# Patient Record
Sex: Female | Born: 1985 | Hispanic: Yes | Marital: Single | Smoking: Never smoker
Health system: Southern US, Community
[De-identification: ages and names within clinical notes are randomized; demographics above are authoritative.]

## PROBLEM LIST (undated history)

## (undated) DIAGNOSIS — K529 Noninfective gastroenteritis and colitis, unspecified: Secondary | ICD-10-CM

## (undated) DIAGNOSIS — K8020 Calculus of gallbladder without cholecystitis without obstruction: Secondary | ICD-10-CM

## (undated) HISTORY — PX: NO PAST SURGERIES: SHX2092

---

## 2017-09-15 ENCOUNTER — Encounter: Payer: Self-pay | Admitting: Emergency Medicine

## 2017-09-15 ENCOUNTER — Inpatient Hospital Stay
Admission: EM | Admit: 2017-09-15 | Discharge: 2017-09-18 | DRG: 418 | Disposition: A | Discharge status: Home / Self Care | Payer: Self-pay | Attending: Internal Medicine | Admitting: Internal Medicine

## 2017-09-15 ENCOUNTER — Emergency Department: Payer: Self-pay

## 2017-09-15 DIAGNOSIS — Z881 Allergy status to other antibiotic agents status: Secondary | ICD-10-CM

## 2017-09-15 DIAGNOSIS — K810 Acute cholecystitis: Secondary | ICD-10-CM | POA: Diagnosis present

## 2017-09-15 DIAGNOSIS — E876 Hypokalemia: Secondary | ICD-10-CM | POA: Diagnosis present

## 2017-09-15 DIAGNOSIS — R109 Unspecified abdominal pain: Secondary | ICD-10-CM

## 2017-09-15 DIAGNOSIS — K859 Acute pancreatitis without necrosis or infection, unspecified: Secondary | ICD-10-CM | POA: Diagnosis present

## 2017-09-15 DIAGNOSIS — K8020 Calculus of gallbladder without cholecystitis without obstruction: Secondary | ICD-10-CM | POA: Diagnosis present

## 2017-09-15 DIAGNOSIS — K5900 Constipation, unspecified: Secondary | ICD-10-CM | POA: Diagnosis present

## 2017-09-15 DIAGNOSIS — K851 Biliary acute pancreatitis without necrosis or infection: Principal | ICD-10-CM | POA: Diagnosis present

## 2017-09-15 HISTORY — DX: Noninfective gastroenteritis and colitis, unspecified: K52.9

## 2017-09-15 HISTORY — DX: Calculus of gallbladder without cholecystitis without obstruction: K80.20

## 2017-09-15 LAB — CBC
HEMATOCRIT: 38.6 % (ref 35.0–47.0)
HEMOGLOBIN: 13.4 g/dL (ref 12.0–16.0)
MCH: 28.9 pg (ref 26.0–34.0)
MCHC: 34.6 g/dL (ref 32.0–36.0)
MCV: 83.4 fL (ref 80.0–100.0)
Platelets: 305 10*3/uL (ref 150–440)
RBC: 4.63 MIL/uL (ref 3.80–5.20)
RDW: 12.8 % (ref 11.5–14.5)
WBC: 6.4 10*3/uL (ref 3.6–11.0)

## 2017-09-15 MED ORDER — MORPHINE SULFATE (PF) 4 MG/ML IV SOLN
4.0000 mg | Freq: Once | INTRAVENOUS | Status: AC
  Administered 2017-09-15: 4 mg via INTRAVENOUS
  Filled 2017-09-15: qty 1

## 2017-09-15 MED ORDER — SODIUM CHLORIDE 0.9 % IV BOLUS (SEPSIS)
1000.0000 mL | Freq: Once | INTRAVENOUS | Status: AC
  Administered 2017-09-15: 1000 mL via INTRAVENOUS

## 2017-09-15 MED ORDER — ONDANSETRON HCL 4 MG/2ML IJ SOLN
4.0000 mg | Freq: Once | INTRAMUSCULAR | Status: AC
  Administered 2017-09-15: 4 mg via INTRAVENOUS
  Filled 2017-09-15: qty 2

## 2017-09-15 NOTE — ED Notes (Addendum)
Pt c/o of R upper abd pain that radiates around to R side and back about 1hr ago. Pt appears uncomfortable. Dr. Zenda Alpers at bedside. nausea when pain began but denies vomiting. States feels constipation so took lactulose yesterday. Denies urinary symptoms. Does state pain goes to chest. Hx of gallstone a year ago. Still has gallbladder.

## 2017-09-15 NOTE — ED Notes (Signed)
Pt taken to US via stretcher

## 2017-09-15 NOTE — ED Triage Notes (Signed)
Pt speaks Spanish and translator present for triage, patient complaining of epigastric pain x 1 hour. Patient states she felt constipated and went to use bathroom and felt a warm feeling on bilateral flanks and epigastric pain that wrapped around to the right side down. Patient has hx of gallstones. Patient states her pain is 8/10. Pt denies nausea or vomitting.

## 2017-09-15 NOTE — ED Provider Notes (Signed)
[facility] Emergency Department Provider Note   ____________________________________________   First MD Initiated Contact with Patient 09/15/17 2307     (approximate)  I have reviewed the triage vital signs and the nursing notes.   HISTORY  Chief Complaint Abdominal Pain    HPI Mckenzie Phelps is a 31 y.o. female who comes into the hospital today with some right upper quadrant abdominal pain. She thinks that her gallbladder. She states that the pain goes down to her ovary on the right and it goes around to her kidneys. The patient also has some right shoulder pain. The symptoms started about an hour prior to her arrival. The patient states that she felt hot in her neck and felt anxious and short of breath. She is not taking anything for pain and she denies ever having pain like this in the past. The patient has been diagnosed with gallstones before and she is not had surgery to have them removed. The patient endorses some nausea with no vomiting. Her pain is 8 out of 10 in intensity currently. The patient states that the pain also goes up into her chest from her abdomen. She states that she has been constipated. She took some lactulose yesterday and reports that she had a long skinny stool about 2 hours prior to her arrival. The patient has come into the hospital today for further evaluation of her symptoms.The patient also denies pain with urination or hematuria.   Past Medical History:  Diagnosis Date  . Gall stone     Patient Active Problem List   Diagnosis Date Noted  . Pancreatitis 09/16/2017  . Gallstone 09/16/2017    Past Surgical History:  Procedure Laterality Date  . NO PAST SURGERIES      Prior to Admission medications   Not on File    Allergies Ciprofloxacin  Family History  Problem Relation Age of Onset  . Family history unknown: Yes    Social History Social History  Substance Use Topics  . Smoking status: Never  Smoker  . Smokeless tobacco: Not on file  . Alcohol use No    Review of Systems  Constitutional: No fever/chills Eyes: No visual changes. ENT: No sore throat. Cardiovascular: chest pain. Respiratory:  shortness of breath. Gastrointestinal:  abdominal pain.   nausea, no vomiting.  No diarrhea.  No constipation. Genitourinary: Negative for dysuria. Musculoskeletal: Negative for back pain. Skin: Negative for rash. Neurological: Negative for headaches, focal weakness or numbness.   ____________________________________________   PHYSICAL EXAM:  VITAL SIGNS: ED Triage Vitals  Enc Vitals Group     BP 09/15/17 2253 114/60     Pulse Rate 09/15/17 2253 64     Resp --      Temp 09/15/17 2253 97.6 F (36.4 C)     Temp Source 09/15/17 2253 Oral     SpO2 09/15/17 2253 100 %     Weight --      Height --      Head Circumference --      Peak Flow --      Pain Score 09/15/17 2301 8     Pain Loc --      Pain Edu? --      Excl. in GC? --     Constitutional: Alert and oriented. Well appearing and in moderate distress. Eyes: Conjunctivae are normal. PERRL. EOMI. Head: Atraumatic. Nose: No congestion/rhinnorhea. Mouth/Throat: Mucous membranes are moist.  Oropharynx non-erythematous. Cardiovascular: Normal rate, regular rhythm. Grossly normal heart sounds.  Good peripheral circulation. Respiratory: Normal respiratory effort.  No retractions. Lungs CTAB. Gastrointestinal: Soft with some diffuse abdominal tenderness to palpation worse on the right upper quadrant and epigastric area. No distention. positive bowel sounds Musculoskeletal: No lower extremity tenderness nor edema.   Neurologic:  Normal speech and language.  Skin:  Skin is warm, dry and intact.  Psychiatric: Mood and affect are normal.   ____________________________________________   LABS (all labs ordered are listed, but only abnormal results are displayed)  Labs Reviewed  LIPASE, BLOOD - Abnormal; Notable for the  following:       Result Value   Lipase 995 (*)    All other components within normal limits  COMPREHENSIVE METABOLIC PANEL - Abnormal; Notable for the following:    Potassium 3.4 (*)    Glucose, Bld 128 (*)    AST 86 (*)    All other components within normal limits  CBC  TROPONIN I  URINALYSIS, COMPLETE (UACMP) WITH MICROSCOPIC  POC URINE PREG, ED   ____________________________________________  EKG  ED ECG REPORT I, Rebecka Apley, the attending physician, personally viewed and interpreted this ECG.   Date: 09/15/2017  EKG Time: 146  Rate: 84  Rhythm: normal sinus rhythm  Axis: normal  Intervals:none  ST&T Change: none  ____________________________________________  RADIOLOGY  Dg Chest Portable 1 View  Result Date: 09/16/2017 CLINICAL DATA:  Epigastric pain.  Chest pain. EXAM: PORTABLE CHEST 1 VIEW COMPARISON:  None. FINDINGS: The cardiomediastinal contours are normal. The lungs are clear. Pulmonary vasculature is normal. No consolidation, pleural effusion, or pneumothorax. No acute osseous abnormalities are seen. IMPRESSION: Unremarkable portable AP view of the chest. Electronically Signed   By: Rubye Oaks M.D.   On: 09/16/2017 00:31   US Abdomen Limited Ruq  Result Date: 09/16/2017 CLINICAL DATA:  31 y/o  F; several hours of abdominal pain. EXAM: ULTRASOUND ABDOMEN LIMITED RIGHT UPPER QUADRANT COMPARISON:  None. FINDINGS: Gallbladder: Mobile gallstone measuring up to 1.4 cm. No gallbladder wall thickening or pericholecystic fluid. Negative sonographic Murphy's sign. Common bile duct: Diameter: 4.3 mm Liver: No focal lesion identified. Within normal limits in parenchymal echogenicity. Portal vein is patent on color Doppler imaging with normal direction of blood flow towards the liver. IMPRESSION: Cholelithiasis.  No findings of acute cholecystitis. Electronically Signed   By: Mitzi Hansen M.D.   On: 09/16/2017 00:00     ____________________________________________   PROCEDURES  Procedure(s) performed: None  Procedures  Critical Care performed: No  ____________________________________________   INITIAL IMPRESSION / ASSESSMENT AND PLAN / ED COURSE  Pertinent labs & imaging results that were available during my care of the patient were reviewed by me and considered in my medical decision making (see chart for details).  This is a 31 year old female who comes into the hospital today with some abdominal pain. The patient has this pain that radiates into her back as well as into her lower abdomen and chest. The patient does have a history of gallstones who will check some blood work to include a troponin, lipase CMP and CBC and I will also perform an ultrasound to look at the patient's gallbladder. I will give the patient some morphine as well as some Zofran. My differential diagnosis includes biliary disease, pancreatitis, gastritis. I will reassess the patient once I received her results.     The patient's ultrasound showed some gallstones but she does appear to have some acute pancreatitis. Although the patient states her pain is improved I will admit her to  the hospital for further evaluation of this first episode of pancreatitis. ____________________________________________   FINAL CLINICAL IMPRESSION(S) / ED DIAGNOSES  Final diagnoses:  Abdominal pain  Acute pancreatitis, unspecified complication status, unspecified pancreatitis type      NEW MEDICATIONS STARTED DURING THIS VISIT:  New Prescriptions   No medications on file     Note:  This document was prepared using Dragon voice recognition software and may include unintentional dictation errors.    Rebecka Apley, MD 09/16/17 [id number]

## 2017-09-16 ENCOUNTER — Emergency Department: Payer: Self-pay

## 2017-09-16 ENCOUNTER — Encounter: Payer: Self-pay | Admitting: Internal Medicine

## 2017-09-16 ENCOUNTER — Inpatient Hospital Stay: Payer: Self-pay

## 2017-09-16 DIAGNOSIS — K8050 Calculus of bile duct without cholangitis or cholecystitis without obstruction: Secondary | ICD-10-CM

## 2017-09-16 DIAGNOSIS — K8020 Calculus of gallbladder without cholecystitis without obstruction: Secondary | ICD-10-CM | POA: Diagnosis present

## 2017-09-16 DIAGNOSIS — K859 Acute pancreatitis without necrosis or infection, unspecified: Secondary | ICD-10-CM | POA: Diagnosis present

## 2017-09-16 LAB — COMPREHENSIVE METABOLIC PANEL
ALBUMIN: 4.2 g/dL (ref 3.5–5.0)
ALBUMIN: 3.7 g/dL (ref 3.5–5.0)
ALK PHOS: 113 U/L (ref 38–126)
ALK PHOS: 123 U/L (ref 38–126)
ALT: 27 U/L (ref 14–54)
ALT: 113 U/L — ABNORMAL HIGH (ref 14–54)
ALT: 98 U/L — ABNORMAL HIGH (ref 14–54)
ANION GAP: 8 (ref 5–15)
ANION GAP: 8 (ref 5–15)
AST: 86 U/L — AB (ref 15–41)
AST: 290 U/L — ABNORMAL HIGH (ref 15–41)
AST: 145 U/L — ABNORMAL HIGH (ref 15–41)
Albumin: 3.5 g/dL (ref 3.5–5.0)
Alkaline Phosphatase: 101 U/L (ref 38–126)
Anion gap: 6 (ref 5–15)
BILIRUBIN TOTAL: 0.9 mg/dL (ref 0.3–1.2)
BILIRUBIN TOTAL: 0.8 mg/dL (ref 0.3–1.2)
BILIRUBIN TOTAL: 0.8 mg/dL (ref 0.3–1.2)
BUN: 9 mg/dL (ref 6–20)
BUN: 7 mg/dL (ref 6–20)
BUN: <5 mg/dL — ABNORMAL LOW (ref 6–20)
CALCIUM: 8.4 mg/dL — AB (ref 8.9–10.3)
CALCIUM: 8.4 mg/dL — AB (ref 8.9–10.3)
CHLORIDE: 106 mmol/L (ref 101–111)
CO2: 27 mmol/L (ref 22–32)
CO2: 25 mmol/L (ref 22–32)
CO2: 23 mmol/L (ref 22–32)
CREATININE: 0.6 mg/dL (ref 0.44–1.00)
Calcium: 9.4 mg/dL (ref 8.9–10.3)
Chloride: 109 mmol/L (ref 101–111)
Chloride: 108 mmol/L (ref 101–111)
Creatinine, Ser: 0.64 mg/dL (ref 0.44–1.00)
Creatinine, Ser: 0.44 mg/dL (ref 0.44–1.00)
GFR calc Af Amer: >60 mL/min (ref >60)
GFR calc Af Amer: >60 mL/min (ref >60)
GFR calc non Af Amer: >60 mL/min (ref >60)
GLUCOSE: 140 mg/dL — AB (ref 65–99)
GLUCOSE: 104 mg/dL — AB (ref 65–99)
Glucose, Bld: 128 mg/dL — ABNORMAL HIGH (ref 65–99)
POTASSIUM: 3.4 mmol/L — AB (ref 3.5–5.1)
POTASSIUM: 3.5 mmol/L (ref 3.5–5.1)
Potassium: 4 mmol/L (ref 3.5–5.1)
Sodium: 141 mmol/L (ref 135–145)
Sodium: 140 mmol/L (ref 135–145)
Sodium: 139 mmol/L (ref 135–145)
TOTAL PROTEIN: 7.6 g/dL (ref 6.5–8.1)
TOTAL PROTEIN: 6.7 g/dL (ref 6.5–8.1)
TOTAL PROTEIN: 6.4 g/dL — AB (ref 6.5–8.1)

## 2017-09-16 LAB — URINALYSIS, COMPLETE (UACMP) WITH MICROSCOPIC
BILIRUBIN URINE: NEGATIVE
Bacteria, UA: NONE SEEN
GLUCOSE, UA: 50 mg/dL — AB
HGB URINE DIPSTICK: NEGATIVE
KETONES UR: 5 mg/dL — AB
LEUKOCYTES UA: NEGATIVE
NITRITE: NEGATIVE
PH: 7 (ref 5.0–8.0)
PROTEIN: NEGATIVE mg/dL
Specific Gravity, Urine: 1.014 (ref 1.005–1.030)
Squamous Epithelial / LPF: NONE SEEN

## 2017-09-16 LAB — TROPONIN I: Troponin I: <0.03 ng/mL (ref <0.03)

## 2017-09-16 LAB — CBC
HEMATOCRIT: 35.1 % (ref 35.0–47.0)
Hemoglobin: 12.3 g/dL (ref 12.0–16.0)
MCH: 29 pg (ref 26.0–34.0)
MCHC: 35.1 g/dL (ref 32.0–36.0)
MCV: 82.8 fL (ref 80.0–100.0)
Platelets: 253 10*3/uL (ref 150–440)
RBC: 4.24 MIL/uL (ref 3.80–5.20)
RDW: 12.8 % (ref 11.5–14.5)
WBC: 7.5 10*3/uL (ref 3.6–11.0)

## 2017-09-16 LAB — LIPASE, BLOOD
LIPASE: 995 U/L — AB (ref 11–51)
Lipase: 487 U/L — ABNORMAL HIGH (ref 11–51)

## 2017-09-16 MED ORDER — SODIUM CHLORIDE 0.9 % IV SOLN
INTRAVENOUS | Status: AC
  Administered 2017-09-16 (×2): via INTRAVENOUS

## 2017-09-16 MED ORDER — ENOXAPARIN SODIUM 40 MG/0.4ML ~~LOC~~ SOLN
40.0000 mg | SUBCUTANEOUS | Status: DC
  Administered 2017-09-16 – 2017-09-17 (×2): 40 mg via SUBCUTANEOUS
  Filled 2017-09-16 (×2): qty 0.4

## 2017-09-16 MED ORDER — SODIUM CHLORIDE 0.9 % IV SOLN
INTRAVENOUS | Status: AC
  Administered 2017-09-16 (×2): via INTRAVENOUS

## 2017-09-16 MED ORDER — KETOROLAC TROMETHAMINE 15 MG/ML IJ SOLN
15.0000 mg | Freq: Four times a day (QID) | INTRAMUSCULAR | Status: DC | PRN
  Filled 2017-09-16: qty 1

## 2017-09-16 MED ORDER — KETOROLAC TROMETHAMINE 30 MG/ML IJ SOLN
INTRAMUSCULAR | Status: AC
  Filled 2017-09-16: qty 1

## 2017-09-16 MED ORDER — ALUM & MAG HYDROXIDE-SIMETH 200-200-20 MG/5ML PO SUSP
30.0000 mL | Freq: Four times a day (QID) | ORAL | Status: DC | PRN
  Administered 2017-09-16: 30 mL via ORAL
  Filled 2017-09-16: qty 30

## 2017-09-16 MED ORDER — ONDANSETRON HCL 4 MG/2ML IJ SOLN
4.0000 mg | Freq: Four times a day (QID) | INTRAMUSCULAR | Status: DC | PRN
  Administered 2017-09-16 (×2): 4 mg via INTRAVENOUS
  Filled 2017-09-16 (×2): qty 2

## 2017-09-16 MED ORDER — METOCLOPRAMIDE HCL 5 MG/ML IJ SOLN
10.0000 mg | Freq: Four times a day (QID) | INTRAMUSCULAR | Status: DC | PRN
  Administered 2017-09-16: 10 mg via INTRAVENOUS
  Filled 2017-09-16: qty 2

## 2017-09-16 MED ORDER — MORPHINE SULFATE (PF) 4 MG/ML IV SOLN
4.0000 mg | INTRAVENOUS | Status: DC | PRN
  Administered 2017-09-16 – 2017-09-17 (×2): 4 mg via INTRAVENOUS
  Filled 2017-09-16 (×2): qty 1

## 2017-09-16 MED ORDER — ONDANSETRON HCL 4 MG PO TABS: 4.0000 mg | ORAL_TABLET | Freq: Four times a day (QID) | ORAL | Status: DC | PRN

## 2017-09-16 MED ORDER — ACETAMINOPHEN 650 MG RE SUPP: 650.0000 mg | Freq: Four times a day (QID) | RECTAL | Status: DC | PRN

## 2017-09-16 MED ORDER — ACETAMINOPHEN 325 MG PO TABS: 650.0000 mg | ORAL_TABLET | Freq: Four times a day (QID) | ORAL | Status: DC | PRN

## 2017-09-16 NOTE — Progress Notes (Signed)
[employer] - [place] [facility]   PATIENT NAME: Mckenzie Phelps    MR#:  [id number]  DATE OF BIRTH:  25-Feb-1986  SUBJECTIVE:  CHIEF COMPLAINT:   Chief Complaint  Patient presents with  . Abdominal Pain   Better abdominal pain and nausea, no vomiting or diarrhea No appetite to eat. REVIEW OF SYSTEMS:  Review of Systems  Constitutional: Negative for chills, fever and malaise/fatigue.  HENT: Negative for sore throat.   Eyes: Negative for blurred vision and double vision.  Respiratory: Negative for cough, hemoptysis, shortness of breath, wheezing and stridor.   Cardiovascular: Negative for chest pain, palpitations, orthopnea and leg swelling.  Gastrointestinal: Positive for abdominal pain and nausea. Negative for blood in stool, diarrhea, melena and vomiting.  Genitourinary: Negative for dysuria, flank pain and hematuria.  Musculoskeletal: Negative for back pain and joint pain.  Skin: Negative for rash.  Neurological: Negative for dizziness, sensory change, focal weakness, seizures, loss of consciousness, weakness and headaches.  Endo/Heme/Allergies: Negative for polydipsia.  Psychiatric/Behavioral: Negative for depression. The patient is not nervous/anxious.     DRUG ALLERGIES:   Allergies  Allergen Reactions  . Ciprofloxacin Swelling   VITALS:  Blood pressure 110/73, pulse 74, temperature 98 F (36.7 C), temperature source Oral, resp. rate 19, height  (1.549 m), weight 109 lb 8 oz (49.7 kg), last menstrual period 09/14/2017, SpO2 99 %. PHYSICAL EXAMINATION:  Physical Exam  Constitutional: She is oriented to person, place, and time and well-developed, well-nourished, and in no distress.  HENT:  Head: Normocephalic.  Mouth/Throat: Oropharynx is clear and moist.  Eyes: Pupils are equal, round, and reactive to light. Conjunctivae and EOM are normal. No scleral icterus.  Neck: Normal range of motion. Neck supple. No JVD present. No  tracheal deviation present.  Cardiovascular: Normal rate, regular rhythm and normal heart sounds.  Exam reveals no gallop.   No murmur heard. Pulmonary/Chest: Effort normal and breath sounds normal. No respiratory distress. She has no wheezes. She has no rales.  Abdominal: Soft. Bowel sounds are normal. She exhibits no distension. There is tenderness. There is no rebound.  Musculoskeletal: Normal range of motion. She exhibits no edema or tenderness.  Neurological: She is alert and oriented to person, place, and time. No cranial nerve deficit.  Skin: No rash noted. No erythema.  Psychiatric: Affect normal.   LABORATORY PANEL:  Female CBC  Recent Labs Lab 09/16/17 0446  WBC 7.5  HGB 12.3  HCT 35.1  PLT 253   ------------------------------------------------------------------------------------------------------------------ Chemistries   Recent Labs Lab 09/16/17 1205  NA 139  K 3.5  CL 108  CO2 23  GLUCOSE 104*  BUN <5*  CREATININE 0.44  CALCIUM 8.4*  AST 145*  ALT 98*  ALKPHOS 123  BILITOT 0.8   RADIOLOGY:  Dg Chest Portable 1 View  Result Date: 09/16/2017 CLINICAL DATA:  Epigastric pain.  Chest pain. EXAM: PORTABLE CHEST 1 VIEW COMPARISON:  None. FINDINGS: The cardiomediastinal contours are normal. The lungs are clear. Pulmonary vasculature is normal. No consolidation, pleural effusion, or pneumothorax. No acute osseous abnormalities are seen. IMPRESSION: Unremarkable portable AP view of the chest. Electronically Signed   By: Rubye Oaks M.D.   On: 09/16/2017 00:31   US Abdomen Limited Ruq  Result Date: 09/16/2017 CLINICAL DATA:  31 y/o  F; several hours of abdominal pain. EXAM: ULTRASOUND ABDOMEN LIMITED RIGHT UPPER QUADRANT COMPARISON:  None. FINDINGS: Gallbladder: Mobile gallstone measuring up to 1.4 cm. No gallbladder wall thickening or pericholecystic  fluid. Negative sonographic Murphy's sign. Common bile duct: Diameter: 4.3 mm Liver: No focal lesion identified.  Within normal limits in parenchymal echogenicity. Portal vein is patent on color Doppler imaging with normal direction of blood flow towards the liver. IMPRESSION: Cholelithiasis.  No findings of acute cholecystitis. Electronically Signed   By: Mitzi Hansen M.D.   On: 09/16/2017 00:00   ASSESSMENT AND PLAN:   Acute pancreatitis due to gallstone. Per Dr. Tobi Bastos, MRCP to r/o stones in the CBD and surgery consult for cholecystectomy. Advance diet as soon as patient wishes to eat , can start with clears and progress.  with when necessary analgesia and antiemetics, IV fluids.  Abnormal liver function tests. Due to above. Follow-up LFT.   I discussed with Dr. Tobi Bastos. All the records are reviewed and case discussed with Care Management/Social Worker. Management plans discussed with the patient, family and they are in agreement.  CODE STATUS: Full Code  TOTAL TIME TAKING CARE OF THIS PATIENT: 36 minutes.   More than 50% of the time was spent in counseling/coordination of care: YES  POSSIBLE D/C IN 2 DAYS, DEPENDING ON CLINICAL CONDITION.   Shaune Pollack M.D on 09/16/2017 at 3:46 PM  Between 7am to 6pm - Pager - [phone]  After 6pm go to [email or web address] - Therapist, nutritional Hospitalists

## 2017-09-16 NOTE — Progress Notes (Signed)
Interpreter informed the patient about MRCP scheduled this evening.  Order received from Dr Imogene Burn for a clear liquid diet

## 2017-09-16 NOTE — Consult Note (Signed)
Patient ID: Mckenzie Phelps, female   DOB: 1986-11-04, 31 y.o.   MRN: [id number]  CC: Abdominal pain  HPI Mckenzie Phelps is a 31 y.o. female who is currently admitted to the medicine service. General surgery consult requested by Dr. Imogene Burn for evaluation of gallstone pancreatitis. Patient is Spanish-speaking and her history was obtained concurrently with myself and my partner Dr. Aleen Campi. Patient reports that her constant pain has resolved and now she is only tender in her midepigastric region. The pain has been going on after meals for many weeks. She denies any fevers, chills, nausea, vomiting, chest pain, shortness of breath, diarrhea, constipation. She states that she was told that she had a gallbladder problem before. She is otherwise in her usual state of health.  HPI  Past Medical History:  Diagnosis Date  . Gall stone     Past Surgical History:  Procedure Laterality Date  . NO PAST SURGERIES      Family History  Problem Relation Age of Onset  . Family history unknown: Yes    Social History Social History  Substance Use Topics  . Smoking status: Never Smoker  . Smokeless tobacco: Never Used  . Alcohol use No    Allergies  Allergen Reactions  . Ciprofloxacin Swelling    Current Facility-Administered Medications  Medication Dose Route Frequency Provider Last Rate Last Dose  . 0.9 %  sodium chloride infusion   Intravenous Continuous Shaune Pollack, MD      . acetaminophen (TYLENOL) tablet 650 mg  650 mg Oral Q6H PRN Oralia Manis, MD       Or  . acetaminophen (TYLENOL) suppository 650 mg  650 mg Rectal Q6H PRN Oralia Manis, MD      . alum & mag hydroxide-simeth (MAALOX/MYLANTA) 200-200-20 MG/5ML suspension 30 mL  30 mL Oral Q6H PRN Shaune Pollack, MD   30 mL at 09/16/17 0831  . enoxaparin (LOVENOX) injection 40 mg  40 mg Subcutaneous Q24H Oralia Manis, MD   40 mg at 09/16/17 0817  . ketorolac (TORADOL) 15 MG/ML injection 15 mg  15 mg Intravenous Q6H PRN  Oralia Manis, MD      . metoCLOPramide (REGLAN) injection 10 mg  10 mg Intravenous Q6H PRN Shaune Pollack, MD   10 mg at 09/16/17 0831  . morphine 4 MG/ML injection 4 mg  4 mg Intravenous Q4H PRN Oralia Manis, MD   4 mg at 09/16/17 0154  . ondansetron (ZOFRAN) tablet 4 mg  4 mg Oral Q6H PRN Oralia Manis, MD       Or  . ondansetron [facility]) injection 4 mg  4 mg Intravenous Q6H PRN Oralia Manis, MD   4 mg at 09/16/17 1191     Review of Systems A Multi-point review of systems was asked and was negative except for the findings documented in the history of present illness  Physical Exam Blood pressure 110/73, pulse 74, temperature 98 F (36.7 C), temperature source Oral, resp. rate 19, height  (1.549 m), weight 49.7 kg (109 lb 8 oz), last menstrual period 09/14/2017, SpO2 99 %. CONSTITUTIONAL: No acute distress. EYES: Pupils are equal, round, and reactive to light, Sclera are non-icteric. EARS, NOSE, MOUTH AND THROAT: The oropharynx is clear. The oral mucosa is pink and moist. Hearing is intact to voice. LYMPH NODES:  Lymph nodes in the neck are normal. RESPIRATORY:  Lungs are clear. There is normal respiratory effort, with equal breath sounds bilaterally, and without pathologic use of accessory muscles. CARDIOVASCULAR: Heart is  regular without murmurs, gallops, or rubs. GI: The abdomen is soft, moderately tender to superficial palpation in the midepigastric region, and nondistended. There are no palpable masses. There is no hepatosplenomegaly. There are normal bowel sounds in all quadrants. GU: Rectal deferred.   MUSCULOSKELETAL: Normal muscle strength and tone. No cyanosis or edema.   SKIN: Turgor is good and there are no pathologic skin lesions or ulcers. NEUROLOGIC: Motor and sensation is grossly normal. Cranial nerves are grossly intact. PSYCH:  Oriented to person, place and time. Affect is normal.  Data Reviewed Images and labs are reviewed which show a lipase of 487, AST of 145,  ALT of 98, bilirubin 0.8, alkaline phosphatase 123. The remainder of her labs are within normal limits including a white blood cell count of 7.5. She had an ultrasound that showed evidence of cholelithiasis but no ductal dilatation, color wall thickening, pericholecystic fluid. I have personally reviewed the patient's imaging, laboratory findings and medical records.    Assessment    Pancreatitis    Plan    31 year old female with likely gallstone pancreatitis. Discussed with the patient that the usual course is that she had passed a stone. Once her pain and labs have returned to normal within be time to discuss a laparoscopic cholecystectomy. At this point she has an MRCP pending. General surgery will follow along with you and once her pancreatitis has fully resolved she will be offered a laparoscopic cholecystectomy prior to discharge. Would recommend continuing IV fluids, limiting oral intake, serial abdominal and laboratory exams.     Time spent with the patient was 80 minutes, with more than 50% of the time spent in face-to-face education, counseling and care coordination.     Ricarda Frame, MD FACS General Surgeon 09/16/2017, 5:15 PM

## 2017-09-16 NOTE — Consult Note (Signed)
Wyline Mood MD, MRCP(U.K) [address]  [address]  [place], [place] [address]  Main: [phone]  Fax: [phone]  Consultation  Referring Provider:   Dr Imogene Burn  Primary Care Physician:  Patient, No Pcp Per Primary Gastroenterologist: None       Reason for Consultation:     Acute pancreatitis   Date of Admission:  09/15/2017 Date of Consultation:  09/16/2017         HPI:   Mckenzie Phelps is a 31 y.o. female presented to the hospital on 09/16/17 with abdominal pain. She says she has been having pain after meals for more than a few months , she was told she had a gall stone but was never referred to a Careers adviser. Yesterday she developed severe epigastric non radiating pain at 10 pm 2 hours after dinner which brought her into the hospital , she also had nausea, vomiting . Feels much better now . Denies starting any new meds, no alcohol consumption. No family history of gall bladder issues, never been on the contraceptive pill  Patient does not speak English and the whole visit was conducted via in person interpretor at the bed side provided by the hospital. She is not yet hungry at this time.   On admission Lipase elevated at 995 ,mild elevation of AST, normal bilirubin . Hb 13.4.this morning AST and ALT is further elevated. RUQ USG suggests CBD 4.3 mm , gall stone in gall bladder .   Past Medical History:  Diagnosis Date  . Gall stone     Past Surgical History:  Procedure Laterality Date  . NO PAST SURGERIES      Prior to Admission medications   Not on File    Family History  Problem Relation Age of Onset  . Family history unknown: Yes     Social History  Substance Use Topics  . Smoking status: Never Smoker  . Smokeless tobacco: Never Used  . Alcohol use No    Allergies as of 09/15/2017 - Review Complete 09/15/2017  Allergen Reaction Noted  . Ciprofloxacin Swelling 09/15/2017    Review of Systems:    All systems reviewed and negative except where noted  in HPI.   Physical Exam:  Vital signs in last 24 hours: Temp:  [97.6 F (36.4 C)-98.3 F (36.8 C)] 98.2 F (36.8 C) (09/26 0406) Pulse Rate:  [64-91] 73 (09/26 0406) Resp:  [12-23] 18 (09/26 0406) BP: (90-115)/(52-71) 95/52 (09/26 0406) SpO2:  [98 %-100 %] 100 % (09/26 0406) Weight:  [109 lb 8 oz (49.7 kg)] 109 lb 8 oz (49.7 kg) (09/26 0300) Last BM Date: 09/15/17 General:   Pleasant, cooperative in NAD Head:  Normocephalic and atraumatic. Eyes:   No icterus.   Conjunctiva pink. PERRLA. Ears:  Normal auditory acuity. Neck:  Supple; no masses or thyroidomegaly Lungs: Respirations even and unlabored. Lungs clear to auscultation bilaterally.   No wheezes, crackles, or rhonchi.  Heart:  Regular rate and rhythm;  Without murmur, clicks, rubs or gallops Abdomen:  Soft, nondistended, nontender. Normal bowel sounds. No appreciable masses or hepatomegaly.  No rebound or guarding.  Neurologic:  Alert and oriented x3;  grossly normal neurologically. Skin:  Intact without significant lesions or rashes. Cervical Nodes:  No significant cervical adenopathy. Psych:  Alert and cooperative. Normal affect.  LAB RESULTS:  Recent Labs  09/15/17 2314 09/16/17 0446  WBC 6.4 7.5  HGB 13.4 12.3  HCT 38.6 35.1  PLT 305 253   BMET  Recent Labs  09/15/17 2314 09/16/17 0446  NA 141 140  K 3.4* 4.0  CL 106 109  CO2 27 25  GLUCOSE 128* 140*  BUN 9 7  CREATININE 0.64 0.60  CALCIUM 9.4 8.4*   LFT  Recent Labs  09/16/17 0446  PROT 6.7  ALBUMIN 3.7  AST 290*  ALT 113*  ALKPHOS 113  BILITOT 0.8   PT/INR No results for input(s): LABPROT, INR in the last 72 hours.  STUDIES: Dg Chest Portable 1 View  Result Date: 09/16/2017 CLINICAL DATA:  Epigastric pain.  Chest pain. EXAM: PORTABLE CHEST 1 VIEW COMPARISON:  None. FINDINGS: The cardiomediastinal contours are normal. The lungs are clear. Pulmonary vasculature is normal. No consolidation, pleural effusion, or pneumothorax. No acute  osseous abnormalities are seen. IMPRESSION: Unremarkable portable AP view of the chest. Electronically Signed   By: Rubye Oaks M.D.   On: 09/16/2017 00:31   US Abdomen Limited Ruq  Result Date: 09/16/2017 CLINICAL DATA:  31 y/o  F; several hours of abdominal pain. EXAM: ULTRASOUND ABDOMEN LIMITED RIGHT UPPER QUADRANT COMPARISON:  None. FINDINGS: Gallbladder: Mobile gallstone measuring up to 1.4 cm. No gallbladder wall thickening or pericholecystic fluid. Negative sonographic Murphy's sign. Common bile duct: Diameter: 4.3 mm Liver: No focal lesion identified. Within normal limits in parenchymal echogenicity. Portal vein is patent on color Doppler imaging with normal direction of blood flow towards the liver. IMPRESSION: Cholelithiasis.  No findings of acute cholecystitis. Electronically Signed   By: Mitzi Hansen M.D.   On: 09/16/2017 00:00      Impression / Plan:   Mckenzie Phelps is a 31 y.o. y/o female with biochemical and history suggestive of acute pancreatitis. With transaminases elevated highly suggestive of gall stone pancreatitis. Bilirubin is not elevated and hence unsure if there is a stone in the CBD although USG does not show the same.   Plan  1. Supportive therapy with IV ringer lactate, analgesia 2. Advance diet as soon as patient wishes to eat , can start with clears and progress.  3. MRCP to r/o stones in the CBD 4. She will require a cholecystectomy -suggest surgery consult to discuss .   Thank you for involving me in the care of this patient.      LOS: 0 days   Wyline Mood, MD  09/16/2017, 11:05 AM

## 2017-09-16 NOTE — Progress Notes (Signed)
Patient continues to nauseated and vomiting after receiving zofran.  Having gas and told the interpreter she thought that was why she was nauseated (due to the gas).  Dr Imogene Burn notified and ordered reglan and maalox

## 2017-09-16 NOTE — H&P (Signed)
[facility] - [place] at [facility]   PATIENT NAME: Mckenzie Phelps    MR#:  [id number]  DATE OF BIRTH:  05/12/1986  DATE OF ADMISSION:  09/15/2017  PRIMARY CARE PHYSICIAN: Patient, No Pcp Per   REQUESTING/REFERRING PHYSICIAN: Zenda Alpers, MD  CHIEF COMPLAINT:   Chief Complaint  Patient presents with  . Abdominal Pain    HISTORY OF PRESENT ILLNESS:  Mckenzie Phelps  is a 31 y.o. female who presents with Acute onset abdominal pain. Patient states that over the past couple of days she's had some episodes of nausea. Today she developed acute epigastric pain radiating to her back. Here in the ED she was found to have pancreatitis, with a lipase greater than 900. An abdominal ultrasound she has a gallstone in her gallbladder, but her common bile duct is not dilated. Patient denies alcohol use. Hospitalists were called for admission and treatment.  PAST MEDICAL HISTORY:   Past Medical History:  Diagnosis Date  . Gall stone     PAST SURGICAL HISTORY:   Past Surgical History:  Procedure Laterality Date  . NO PAST SURGERIES      SOCIAL HISTORY:   Social History  Substance Use Topics  . Smoking status: Never Smoker  . Smokeless tobacco: Not on file  . Alcohol use No    FAMILY HISTORY:   Family History  Problem Relation Age of Onset  . Family history unknown: Yes    DRUG ALLERGIES:   Allergies  Allergen Reactions  . Ciprofloxacin Swelling    MEDICATIONS AT HOME:   Prior to Admission medications   Not on File    REVIEW OF SYSTEMS:  Review of Systems  Constitutional: Negative for chills, fever, malaise/fatigue and weight loss.  HENT: Negative for ear pain, hearing loss and tinnitus.   Eyes: Negative for blurred vision, double vision, pain and redness.  Respiratory: Negative for cough, hemoptysis and shortness of breath.   Cardiovascular: Negative for chest pain, palpitations, orthopnea and leg swelling.   Gastrointestinal: Positive for abdominal pain and nausea. Negative for constipation, diarrhea and vomiting.  Genitourinary: Negative for dysuria, frequency and hematuria.  Musculoskeletal: Negative for back pain, joint pain and neck pain.  Skin:       No acne, rash, or lesions  Neurological: Negative for dizziness, tremors, focal weakness and weakness.  Endo/Heme/Allergies: Negative for polydipsia. Does not bruise/bleed easily.  Psychiatric/Behavioral: Negative for depression. The patient is not nervous/anxious and does not have insomnia.      VITAL SIGNS:   Vitals:   09/15/17 2253 09/15/17 2332 09/16/17 0000 09/16/17 0030  BP: 114/60 104/69 103/63 (!) 98/59  Pulse: 64 72 77 72  Resp:  12 (!) 23 13  Temp: 97.6 F (36.4 C)     TempSrc: Oral     SpO2: 100% 99% 99% 100%   Wt Readings from Last 3 Encounters:  No data found for Wt    PHYSICAL EXAMINATION:  Physical Exam  Vitals reviewed. Constitutional: She is oriented to person, place, and time. She appears well-developed and well-nourished. No distress.  HENT:  Head: Normocephalic and atraumatic.  Mouth/Throat: Oropharynx is clear and moist.  Eyes: Pupils are equal, round, and reactive to light. Conjunctivae and EOM are normal. No scleral icterus.  Neck: Normal range of motion. Neck supple. No JVD present. No thyromegaly present.  Cardiovascular: Normal rate, regular rhythm and intact distal pulses.  Exam reveals no gallop and no friction rub.   No murmur heard. Respiratory: Effort normal  and breath sounds normal. No respiratory distress. She has no wheezes. She has no rales.  GI: Soft. Bowel sounds are normal. She exhibits no distension. There is tenderness.  Musculoskeletal: Normal range of motion. She exhibits no edema.  No arthritis, no gout  Lymphadenopathy:    She has no cervical adenopathy.  Neurological: She is alert and oriented to person, place, and time. No cranial nerve deficit.  No dysarthria, no aphasia   Skin: Skin is warm and dry. No rash noted. No erythema.  Psychiatric: She has a normal mood and affect. Her behavior is normal. Judgment and thought content normal.    LABORATORY PANEL:   CBC  Recent Labs Lab 09/15/17 2314  WBC 6.4  HGB 13.4  HCT 38.6  PLT 305   ------------------------------------------------------------------------------------------------------------------  Chemistries   Recent Labs Lab 09/15/17 2314  NA 141  K 3.4*  CL 106  CO2 27  GLUCOSE 128*  BUN 9  CREATININE 0.64  CALCIUM 9.4  AST 86*  ALT 27  ALKPHOS 101  BILITOT 0.9   ------------------------------------------------------------------------------------------------------------------  Cardiac Enzymes  Recent Labs Lab 09/15/17 2314  TROPONINI <0.03   ------------------------------------------------------------------------------------------------------------------  RADIOLOGY:  Dg Chest Portable 1 View  Result Date: 09/16/2017 CLINICAL DATA:  Epigastric pain.  Chest pain. EXAM: PORTABLE CHEST 1 VIEW COMPARISON:  None. FINDINGS: The cardiomediastinal contours are normal. The lungs are clear. Pulmonary vasculature is normal. No consolidation, pleural effusion, or pneumothorax. No acute osseous abnormalities are seen. IMPRESSION: Unremarkable portable AP view of the chest. Electronically Signed   By: Rubye Oaks M.D.   On: 09/16/2017 00:31   US Abdomen Limited Ruq  Result Date: 09/16/2017 CLINICAL DATA:  31 y/o  F; several hours of abdominal pain. EXAM: ULTRASOUND ABDOMEN LIMITED RIGHT UPPER QUADRANT COMPARISON:  None. FINDINGS: Gallbladder: Mobile gallstone measuring up to 1.4 cm. No gallbladder wall thickening or pericholecystic fluid. Negative sonographic Murphy's sign. Common bile duct: Diameter: 4.3 mm Liver: No focal lesion identified. Within normal limits in parenchymal echogenicity. Portal vein is patent on color Doppler imaging with normal direction of blood flow towards the  liver. IMPRESSION: Cholelithiasis.  No findings of acute cholecystitis. Electronically Signed   By: Mitzi Hansen M.D.   On: 09/16/2017 00:00    EKG:   Orders placed or performed during the hospital encounter of 09/15/17  . ED EKG  . ED EKG    IMPRESSION AND PLAN:  Principal Problem:   Pancreatitis - lipase greater than 900. Patient does have a gallstone, but is unclear at this time she may have passed a smaller gallstone as her common bile duct is not dilated. We will keep her nothing by mouth for now, with when necessary analgesia and antiemetics, IV fluids, GI consult Active Problems:   Gallstone - treatment as above  All the records are reviewed and case discussed with ED provider. Management plans discussed with the patient and/or family.  DVT PROPHYLAXIS: SubQ lovenox  GI PROPHYLAXIS: None  ADMISSION STATUS: Inpatient  CODE STATUS: Full Code Status History    This patient does not have a recorded code status. Please follow your organizational policy for patients in this situation.      TOTAL TIME TAKING CARE OF THIS PATIENT: 45 minutes.   Donte Kary FIELDING 09/16/2017, 1:23 AM  [employer]  [phone]  CC: Primary care physician; Patient, No Pcp Per  Note:  This document was prepared using Dragon voice recognition software and may include unintentional dictation errors.

## 2017-09-16 NOTE — ED Notes (Signed)
X-ray at bedside

## 2017-09-17 ENCOUNTER — Encounter: Payer: Self-pay | Admitting: Internal Medicine

## 2017-09-17 DIAGNOSIS — K859 Acute pancreatitis without necrosis or infection, unspecified: Secondary | ICD-10-CM

## 2017-09-17 LAB — COMPREHENSIVE METABOLIC PANEL
ALT: 61 U/L — ABNORMAL HIGH (ref 14–54)
AST: 48 U/L — ABNORMAL HIGH (ref 15–41)
Albumin: 3.2 g/dL — ABNORMAL LOW (ref 3.5–5.0)
Alkaline Phosphatase: 107 U/L (ref 38–126)
Anion gap: 4 — ABNORMAL LOW (ref 5–15)
BUN: <5 mg/dL — ABNORMAL LOW (ref 6–20)
CHLORIDE: 112 mmol/L — AB (ref 101–111)
CO2: 24 mmol/L (ref 22–32)
CREATININE: 0.46 mg/dL (ref 0.44–1.00)
Calcium: 8.1 mg/dL — ABNORMAL LOW (ref 8.9–10.3)
Glucose, Bld: 87 mg/dL (ref 65–99)
Potassium: 3.3 mmol/L — ABNORMAL LOW (ref 3.5–5.1)
SODIUM: 140 mmol/L (ref 135–145)
Total Bilirubin: 0.9 mg/dL (ref 0.3–1.2)
Total Protein: 5.8 g/dL — ABNORMAL LOW (ref 6.5–8.1)

## 2017-09-17 LAB — HIV ANTIBODY (ROUTINE TESTING W REFLEX): HIV SCREEN 4TH GENERATION: NONREACTIVE

## 2017-09-17 LAB — MAGNESIUM: MAGNESIUM: 1.8 mg/dL (ref 1.7–2.4)

## 2017-09-17 LAB — LIPASE, BLOOD: LIPASE: 62 U/L — AB (ref 11–51)

## 2017-09-17 MED ORDER — POTASSIUM CHLORIDE CRYS ER 20 MEQ PO TBCR
40.0000 meq | EXTENDED_RELEASE_TABLET | Freq: Once | ORAL | Status: AC
  Administered 2017-09-17: 40 meq via ORAL
  Filled 2017-09-17: qty 2

## 2017-09-17 MED ORDER — SODIUM CHLORIDE 0.9 % IV SOLN
INTRAVENOUS | Status: DC
  Administered 2017-09-17 (×2): via INTRAVENOUS

## 2017-09-17 NOTE — Progress Notes (Signed)
Dr Tonita Cong gave an order for clear liquid diet and then npo after midnight

## 2017-09-17 NOTE — Progress Notes (Signed)
CC: Gallstone pancreatitis Subjective: Patient reports that she does feel somewhat better than yesterday but continues to have tenderness on movement and on palpation to her abdomen. She denies any fevers, chills, nausea, vomiting.  Objective: Vital signs in last 24 hours: Temp:  [98 F (36.7 C)] 98 F (36.7 C) (09/27 0455) Pulse Rate:  [59-74] 59 (09/27 0455) Resp:  [17-19] 19 (09/27 0455) BP: (105-110)/(56-73) 106/56 (09/27 0455) SpO2:  [99 %-100 %] 99 % (09/27 0455) Last BM Date: 09/15/17  Intake/Output from previous day: 09/26 0701 - 09/27 0700 In: 2358.3 [I.V.:2358.3] Out: 1450 [Urine:1450] Intake/Output this shift: No intake/output data recorded.  Physical exam:  Gen.: No acute distress Chest: Clear to auscultation  heart: Regular rate and rhythm Abdomen: Soft, mildly tender to deep palpation in the upper abdomen, nondistended.  Lab Results: CBC   Recent Labs  09/15/17 2314 09/16/17 0446  WBC 6.4 7.5  HGB 13.4 12.3  HCT 38.6 35.1  PLT 305 253   BMET  Recent Labs  09/16/17 1205 09/17/17 0419  NA 139 140  K 3.5 3.3*  CL 108 112*  CO2 23 24  GLUCOSE 104* 87  BUN <5* <5*  CREATININE 0.44 0.46  CALCIUM 8.4* 8.1*   PT/INR No results for input(s): LABPROT, INR in the last 72 hours. ABG No results for input(s): PHART, HCO3 in the last 72 hours.  Invalid input(s): PCO2, PO2  Studies/Results: Mr Abdomen Mrcp Wo Contrast  Result Date: 09/16/2017 CLINICAL DATA:  31 year old female with history of constant abdominal pain, most severe in the mid epigastric region falling meals for the past several weeks. No associated fevers, chills, nausea, vomiting, diarrhea or constipation. EXAM: MRI ABDOMEN WITHOUT CONTRAST  (INCLUDING MRCP) TECHNIQUE: Multiplanar multisequence MR imaging of the abdomen was performed. Heavily T2-weighted images of the biliary and pancreatic ducts were obtained, and three-dimensional MRCP images were rendered by post processing.  COMPARISON:  No priors. FINDINGS: Comment: Study is limited for detection and characterization of visceral and/or vascular lesions by lack of IV gadolinium. Lower chest: Unremarkable. Hepatobiliary: No cystic or solid hepatic lesions. No intra or extrahepatic biliary ductal dilatation noted on MRCP images. Gallbladder is only moderately distended. Filling defects in the gallbladder, compatible with gallstones, measuring up to 13 mm in diameter. Gallbladder wall appears edematous with trace volume of pericholecystic fluid. No filling defect in the common bile duct noted on MRCP images. Common bile duct measures 4 mm in the porta hepatis. Pancreas: No definite pancreatic mass on today's noncontrast examination. No pancreatic ductal dilatation noted on MRCP images. Trace amount of peripancreatic T2 hyperintensity, suggesting fluid in inflammation around the pancreas, concerning for an acute pancreatitis. No well-defined peripancreatic fluid collections are noted. Spleen:  Unremarkable. Adrenals/Urinary Tract: Bilateral kidneys and bilateral adrenal glands are normal in appearance. No hydroureteronephrosis in the visualized portions of the abdomen. Stomach/Bowel: Visualized portions are unremarkable. Vascular/Lymphatic: No aneurysm identified in the visualized abdominal vasculature. No lymphadenopathy noted in the abdomen. Other: Trace volume of ascites extending beneath the right lobe of the liver. Musculoskeletal: No aggressive appearing osseous lesions are noted in the visualized portions of the skeleton. IMPRESSION: 1. Small amount of peripancreatic fluid and inflammatory changes compatible with an acute pancreatitis. 2. Interval development of edema in the gallbladder wall and trace amount of pericholecystic fluid. There are gallstones present, but the gallbladder is only moderately distended. Overall, imaging findings are equivocal for an acute cholecystitis, and the interval change in the appearance of the  gallbladder compared with yesterday's ultrasound  examination may simply be reactive related to inflammatory changes in the setting of acute pancreatitis. Clinical correlation is recommended. 3. No evidence of choledocholithiasis. No intra or extrahepatic biliary ductal dilatation to suggest obstruction. Electronically Signed   By: Trudie Reed M.D.   On: 09/16/2017 20:36   Dg Chest Portable 1 View  Result Date: 09/16/2017 CLINICAL DATA:  Epigastric pain.  Chest pain. EXAM: PORTABLE CHEST 1 VIEW COMPARISON:  None. FINDINGS: The cardiomediastinal contours are normal. The lungs are clear. Pulmonary vasculature is normal. No consolidation, pleural effusion, or pneumothorax. No acute osseous abnormalities are seen. IMPRESSION: Unremarkable portable AP view of the chest. Electronically Signed   By: Rubye Oaks M.D.   On: 09/16/2017 00:31   US Abdomen Limited Ruq  Result Date: 09/16/2017 CLINICAL DATA:  31 y/o  F; several hours of abdominal pain. EXAM: ULTRASOUND ABDOMEN LIMITED RIGHT UPPER QUADRANT COMPARISON:  None. FINDINGS: Gallbladder: Mobile gallstone measuring up to 1.4 cm. No gallbladder wall thickening or pericholecystic fluid. Negative sonographic Murphy's sign. Common bile duct: Diameter: 4.3 mm Liver: No focal lesion identified. Within normal limits in parenchymal echogenicity. Portal vein is patent on color Doppler imaging with normal direction of blood flow towards the liver. IMPRESSION: Cholelithiasis.  No findings of acute cholecystitis. Electronically Signed   By: Mitzi Hansen M.D.   On: 09/16/2017 00:00    Anti-infectives: Anti-infectives    None      Assessment/Plan:  31 year old female with gallstone pancreatitis. Through the use of interpreter discussed the usual disease progression as well as the treatment options that include a laparoscopic cholecystectomy. Discussed the rationale for performing surgery prior to discharge. Given that her lipase is still  elevated although improved discussed with the lightweight an additional day to ensure complete resolution of her pancreatitis prior to proceeding with surgery. She voiced understanding and all questions were asked. Discussion clear liquids today with nothing after midnight with anticipation of surgery tomorrow.  Anibal Quinby T. Tonita Cong, MD, [facility]  [facility] [phone] Night [facility] [phone] 09/17/2017

## 2017-09-17 NOTE — Progress Notes (Signed)
[facility] - [place] at [facility]   PATIENT NAME: Mckenzie Phelps    MR#:  [id number]  DATE OF BIRTH:  [birth year]  SUBJECTIVE:  CHIEF COMPLAINT:   Chief Complaint  Patient presents with  . Abdominal Pain   The patient has no abdominal pain, nausea or vomiting, tolerated clear liquid diet. REVIEW OF SYSTEMS:  Review of Systems  Constitutional: Negative for chills, fever and malaise/fatigue.  HENT: Negative for sore throat.   Eyes: Negative for blurred vision and double vision.  Respiratory: Negative for cough, hemoptysis, shortness of breath, wheezing and stridor.   Cardiovascular: Negative for chest pain, palpitations, orthopnea and leg swelling.  Gastrointestinal: Negative for abdominal pain, blood in stool, diarrhea, melena, nausea and vomiting.  Genitourinary: Negative for dysuria, flank pain and hematuria.  Musculoskeletal: Negative for back pain and joint pain.  Skin: Negative for rash.  Neurological: Negative for dizziness, sensory change, focal weakness, seizures, loss of consciousness, weakness and headaches.  Endo/Heme/Allergies: Negative for polydipsia.  Psychiatric/Behavioral: Negative for depression. The patient is not nervous/anxious.     DRUG ALLERGIES:   Allergies  Allergen Reactions  . Ciprofloxacin Swelling   VITALS:  Blood pressure 106/61, pulse 63, temperature 98 F (36.7 C), temperature source Oral, resp. rate 19, height  (1.549 m), weight 109 lb 8 oz (49.7 kg), last menstrual period 09/14/2017, SpO2 99 %. PHYSICAL EXAMINATION:  Physical Exam  Constitutional: She is oriented to person, place, and time and well-developed, well-nourished, and in no distress.  HENT:  Head: Normocephalic.  Mouth/Throat: Oropharynx is clear and moist.  Eyes: Pupils are equal, round, and reactive to light. Conjunctivae and EOM are normal. No scleral icterus.  Neck: Normal range of motion. Neck supple. No JVD present. No tracheal  deviation present.  Cardiovascular: Normal rate, regular rhythm and normal heart sounds.  Exam reveals no gallop.   No murmur heard. Pulmonary/Chest: Effort normal and breath sounds normal. No respiratory distress. She has no wheezes. She has no rales.  Abdominal: Soft. Bowel sounds are normal. She exhibits no distension. There is no tenderness. There is no rebound.  Musculoskeletal: Normal range of motion. She exhibits no edema or tenderness.  Neurological: She is alert and oriented to person, place, and time. No cranial nerve deficit.  Skin: No rash noted. No erythema.  Psychiatric: Affect normal.   LABORATORY PANEL:  Female CBC  Recent Labs Lab 09/16/17 0446  WBC 7.5  HGB 12.3  HCT 35.1  PLT 253   ------------------------------------------------------------------------------------------------------------------ Chemistries   Recent Labs Lab 09/17/17 0419  NA 140  K 3.3*  CL 112*  CO2 24  GLUCOSE 87  BUN <5*  CREATININE 0.46  CALCIUM 8.1*  MG 1.8  AST 48*  ALT 61*  ALKPHOS 107  BILITOT 0.9   RADIOLOGY:  Mr Abdomen Mrcp Wo Contrast  Result Date: 09/16/2017 CLINICAL DATA:  31 year old female with history of constant abdominal pain, most severe in the mid epigastric region falling meals for the past several weeks. No associated fevers, chills, nausea, vomiting, diarrhea or constipation. EXAM: MRI ABDOMEN WITHOUT CONTRAST  (INCLUDING MRCP) TECHNIQUE: Multiplanar multisequence MR imaging of the abdomen was performed. Heavily T2-weighted images of the biliary and pancreatic ducts were obtained, and three-dimensional MRCP images were rendered by post processing. COMPARISON:  No priors. FINDINGS: Comment: Study is limited for detection and characterization of visceral and/or vascular lesions by lack of IV gadolinium. Lower chest: Unremarkable. Hepatobiliary: No cystic or solid hepatic lesions. No intra or extrahepatic biliary  ductal dilatation noted on MRCP images. Gallbladder is  only moderately distended. Filling defects in the gallbladder, compatible with gallstones, measuring up to 13 mm in diameter. Gallbladder wall appears edematous with trace volume of pericholecystic fluid. No filling defect in the common bile duct noted on MRCP images. Common bile duct measures 4 mm in the porta hepatis. Pancreas: No definite pancreatic mass on today's noncontrast examination. No pancreatic ductal dilatation noted on MRCP images. Trace amount of peripancreatic T2 hyperintensity, suggesting fluid in inflammation around the pancreas, concerning for an acute pancreatitis. No well-defined peripancreatic fluid collections are noted. Spleen:  Unremarkable. Adrenals/Urinary Tract: Bilateral kidneys and bilateral adrenal glands are normal in appearance. No hydroureteronephrosis in the visualized portions of the abdomen. Stomach/Bowel: Visualized portions are unremarkable. Vascular/Lymphatic: No aneurysm identified in the visualized abdominal vasculature. No lymphadenopathy noted in the abdomen. Other: Trace volume of ascites extending beneath the right lobe of the liver. Musculoskeletal: No aggressive appearing osseous lesions are noted in the visualized portions of the skeleton. IMPRESSION: 1. Small amount of peripancreatic fluid and inflammatory changes compatible with an acute pancreatitis. 2. Interval development of edema in the gallbladder wall and trace amount of pericholecystic fluid. There are gallstones present, but the gallbladder is only moderately distended. Overall, imaging findings are equivocal for an acute cholecystitis, and the interval change in the appearance of the gallbladder compared with yesterday's ultrasound examination may simply be reactive related to inflammatory changes in the setting of acute pancreatitis. Clinical correlation is recommended. 3. No evidence of choledocholithiasis. No intra or extrahepatic biliary ductal dilatation to suggest obstruction. Electronically Signed    By: Trudie Reed M.D.   On: 09/16/2017 20:36   ASSESSMENT AND PLAN:   Acute pancreatitis due to gallstone. Per Dr. Tobi Bastos, MRCP: Overall, imaging findings are equivocal for an acute cholecystitis. No evidence of choledocholithiasis. Per Dr. Tonita Cong, laparoscopy cholecystectomy tomorrow. when necessary analgesia and antiemetics, IV fluids.  Hypokalemia. Give potassium supplement and a follow-up level. Abnormal liver function tests. Due to above. Improved. History of colitis per patient. Stable.   I discussed with Dr. Tobi Bastos. All the records are reviewed and case discussed with Care Management/Social Worker. Management plans discussed with the patient, Her father and they are in agreement.  CODE STATUS: Full Code  TOTAL TIME TAKING CARE OF THIS PATIENT: 33 minutes.   More than 50% of the time was spent in counseling/coordination of care: YES  POSSIBLE D/C IN 2 DAYS, DEPENDING ON CLINICAL CONDITION.   Shaune Pollack M.D on 09/17/2017 at 2:54 PM  Between 7am to 6pm - Pager - [phone]  After 6pm go to [email or web address] - Therapist, nutritional Hospitalists

## 2017-09-17 NOTE — Progress Notes (Signed)
Wyline Mood MD, MRCP(U.K) [address]  [address]  [place], [place] [address]  Main: [phone]    Mckenzie Phelps is being followed for gall stone pancreatitis  Day 1 of follow up   Subjective: No new complaints, doing well , no pain .    Objective: Vital signs in last 24 hours: Vitals:   09/16/17 1140 09/16/17 2009 09/17/17 0455 09/17/17 1153  BP: 110/73 105/62 (!) 106/56 106/61  Pulse: 74 73 (!) 59 63  Resp: Temp: 98 F (36.7 C) 98 F (36.7 C) 98 F (36.7 C) 98 F (36.7 C)  TempSrc: Oral Oral Oral Oral  SpO2: 99% 100% 99% 99%  Weight:      Height:       Weight change:   Intake/Output Summary (Last 24 hours) at 09/17/17 1401 Last data filed at 09/17/17 0500  Gross per 24 hour  Intake          1529.16 ml  Output             1450 ml  Net            79.16 ml     Exam: Heart:: Regular rate and rhythm, S1S2 present or without murmur or extra heart sounds Lungs: normal, clear to auscultation and clear to auscultation and percussion Abdomen: soft, nontender, normal bowel sounds   Lab Results: @ Micro Results: No results found for this or any previous visit (from the past 240 hour(s)). Studies/Results: Mr Abdomen Mrcp Wo Contrast  Result Date: 09/16/2017 CLINICAL DATA:  31 year old female with history of constant abdominal pain, most severe in the mid epigastric region falling meals for the past several weeks. No associated fevers, chills, nausea, vomiting, diarrhea or constipation. EXAM: MRI ABDOMEN WITHOUT CONTRAST  (INCLUDING MRCP) TECHNIQUE: Multiplanar multisequence MR imaging of the abdomen was performed. Heavily T2-weighted images of the biliary and pancreatic ducts were obtained, and three-dimensional MRCP images were rendered by post processing. COMPARISON:  No priors. FINDINGS: Comment: Study is limited for detection and characterization of visceral and/or vascular lesions by lack of IV gadolinium. Lower chest:  Unremarkable. Hepatobiliary: No cystic or solid hepatic lesions. No intra or extrahepatic biliary ductal dilatation noted on MRCP images. Gallbladder is only moderately distended. Filling defects in the gallbladder, compatible with gallstones, measuring up to 13 mm in diameter. Gallbladder wall appears edematous with trace volume of pericholecystic fluid. No filling defect in the common bile duct noted on MRCP images. Common bile duct measures 4 mm in the porta hepatis. Pancreas: No definite pancreatic mass on today's noncontrast examination. No pancreatic ductal dilatation noted on MRCP images. Trace amount of peripancreatic T2 hyperintensity, suggesting fluid in inflammation around the pancreas, concerning for an acute pancreatitis. No well-defined peripancreatic fluid collections are noted. Spleen:  Unremarkable. Adrenals/Urinary Tract: Bilateral kidneys and bilateral adrenal glands are normal in appearance. No hydroureteronephrosis in the visualized portions of the abdomen. Stomach/Bowel: Visualized portions are unremarkable. Vascular/Lymphatic: No aneurysm identified in the visualized abdominal vasculature. No lymphadenopathy noted in the abdomen. Other: Trace volume of ascites extending beneath the right lobe of the liver. Musculoskeletal: No aggressive appearing osseous lesions are noted in the visualized portions of the skeleton. IMPRESSION: 1. Small amount of peripancreatic fluid and inflammatory changes compatible with an acute pancreatitis. 2. Interval development of edema in the gallbladder wall and trace amount of pericholecystic fluid. There are gallstones present, but the gallbladder is only moderately distended. Overall, imaging findings are equivocal for an  acute cholecystitis, and the interval change in the appearance of the gallbladder compared with yesterday's ultrasound examination may simply be reactive related to inflammatory changes in the setting of acute pancreatitis. Clinical correlation  is recommended. 3. No evidence of choledocholithiasis. No intra or extrahepatic biliary ductal dilatation to suggest obstruction. Electronically Signed   By: Trudie Reed M.D.   On: 09/16/2017 20:36   Dg Chest Portable 1 View  Result Date: 09/16/2017 CLINICAL DATA:  Epigastric pain.  Chest pain. EXAM: PORTABLE CHEST 1 VIEW COMPARISON:  None. FINDINGS: The cardiomediastinal contours are normal. The lungs are clear. Pulmonary vasculature is normal. No consolidation, pleural effusion, or pneumothorax. No acute osseous abnormalities are seen. IMPRESSION: Unremarkable portable AP view of the chest. Electronically Signed   By: Rubye Oaks M.D.   On: 09/16/2017 00:31   US Abdomen Limited Ruq  Result Date: 09/16/2017 CLINICAL DATA:  31 y/o  F; several hours of abdominal pain. EXAM: ULTRASOUND ABDOMEN LIMITED RIGHT UPPER QUADRANT COMPARISON:  None. FINDINGS: Gallbladder: Mobile gallstone measuring up to 1.4 cm. No gallbladder wall thickening or pericholecystic fluid. Negative sonographic Murphy's sign. Common bile duct: Diameter: 4.3 mm Liver: No focal lesion identified. Within normal limits in parenchymal echogenicity. Portal vein is patent on color Doppler imaging with normal direction of blood flow towards the liver. IMPRESSION: Cholelithiasis.  No findings of acute cholecystitis. Electronically Signed   By: Mitzi Hansen M.D.   On: 09/16/2017 00:00   Medications: I have reviewed the patient's current medications. Scheduled Meds: . enoxaparin (LOVENOX) injection  40 mg Subcutaneous Q24H   Continuous Infusions: . sodium chloride 125 mL/hr at 09/17/17 0849   PRN Meds:.acetaminophen **OR** acetaminophen, alum & mag hydroxide-simeth, ketorolac, metoCLOPramide (REGLAN) injection, morphine injection, ondansetron **OR** ondansetron (ZOFRAN) IV   Assessment: Principal Problem:   Pancreatitis Active Problems:   Gallstone  Mckenzie Phelps is a 31 y.o. y/o female with  biochemical and history suggestive of acute pancreatitis. MRCP shows no stones in CBD. Seen by surgery and plan for cholecystectomy before discharge .   Plan  1.Advance diet as soon as patient wishes , doing well on clears   I will sign off.  Please call me if any further GI concerns or questions.  We would like to thank you for the opportunity to participate in the care of Mckenzie Phelps.    LOS: 1 day   Wyline Mood 09/17/2017, 2:01 PM

## 2017-09-18 ENCOUNTER — Inpatient Hospital Stay: Payer: Self-pay | Admitting: Anesthesiology

## 2017-09-18 ENCOUNTER — Encounter: Payer: Self-pay | Admitting: Certified Registered Nurse Anesthetist

## 2017-09-18 ENCOUNTER — Encounter
Admission: EM | Disposition: A | Discharge status: Home / Self Care | Payer: Self-pay | Source: Home / Self Care | Attending: Internal Medicine

## 2017-09-18 ENCOUNTER — Inpatient Hospital Stay: Payer: Self-pay

## 2017-09-18 HISTORY — PX: CHOLECYSTECTOMY: SHX55

## 2017-09-18 LAB — BASIC METABOLIC PANEL
Anion gap: 7 (ref 5–15)
BUN: <5 mg/dL — ABNORMAL LOW (ref 6–20)
CHLORIDE: 108 mmol/L (ref 101–111)
CO2: 26 mmol/L (ref 22–32)
Calcium: 8.8 mg/dL — ABNORMAL LOW (ref 8.9–10.3)
Creatinine, Ser: 0.5 mg/dL (ref 0.44–1.00)
GFR calc non Af Amer: >60 mL/min (ref >60)
Glucose, Bld: 89 mg/dL (ref 65–99)
POTASSIUM: 3.7 mmol/L (ref 3.5–5.1)
SODIUM: 141 mmol/L (ref 135–145)

## 2017-09-18 LAB — POCT PREGNANCY, URINE
PREG TEST UR: NEGATIVE
Preg Test, Ur: NEGATIVE

## 2017-09-18 SURGERY — LAPAROSCOPIC CHOLECYSTECTOMY WITH INTRAOPERATIVE CHOLANGIOGRAM
Anesthesia: General | Site: Abdomen | Wound class: Clean Contaminated

## 2017-09-18 MED ORDER — ACETAMINOPHEN 10 MG/ML IV SOLN
INTRAVENOUS | Status: DC | PRN
  Administered 2017-09-18: 1000 mg via INTRAVENOUS

## 2017-09-18 MED ORDER — ROCURONIUM BROMIDE 50 MG/5ML IV SOLN
INTRAVENOUS | Status: AC
  Filled 2017-09-18: qty 1

## 2017-09-18 MED ORDER — FENTANYL CITRATE (PF) 100 MCG/2ML IJ SOLN
25.0000 ug | INTRAMUSCULAR | Status: DC | PRN
  Administered 2017-09-18 (×4): 25 ug via INTRAVENOUS

## 2017-09-18 MED ORDER — PROPOFOL 10 MG/ML IV BOLUS
INTRAVENOUS | Status: AC
  Filled 2017-09-18: qty 20

## 2017-09-18 MED ORDER — MEPERIDINE HCL 50 MG/ML IJ SOLN: 6.2500 mg | INTRAMUSCULAR | Status: DC | PRN

## 2017-09-18 MED ORDER — PROMETHAZINE HCL 25 MG/ML IJ SOLN: 6.2500 mg | INTRAMUSCULAR | Status: DC | PRN

## 2017-09-18 MED ORDER — DEXAMETHASONE SODIUM PHOSPHATE 10 MG/ML IJ SOLN
INTRAMUSCULAR | Status: AC
  Filled 2017-09-18: qty 1

## 2017-09-18 MED ORDER — MIDAZOLAM HCL 2 MG/2ML IJ SOLN
INTRAMUSCULAR | Status: AC
  Filled 2017-09-18: qty 2

## 2017-09-18 MED ORDER — ROCURONIUM BROMIDE 100 MG/10ML IV SOLN
INTRAVENOUS | Status: DC | PRN
  Administered 2017-09-18: 30 mg via INTRAVENOUS

## 2017-09-18 MED ORDER — FENTANYL CITRATE (PF) 100 MCG/2ML IJ SOLN
INTRAMUSCULAR | Status: AC
  Filled 2017-09-18: qty 2

## 2017-09-18 MED ORDER — DEXAMETHASONE SODIUM PHOSPHATE 10 MG/ML IJ SOLN
INTRAMUSCULAR | Status: DC | PRN
  Administered 2017-09-18: 10 mg via INTRAVENOUS

## 2017-09-18 MED ORDER — ONDANSETRON HCL 4 MG/2ML IJ SOLN
INTRAMUSCULAR | Status: AC
  Filled 2017-09-18: qty 2

## 2017-09-18 MED ORDER — KETOROLAC TROMETHAMINE 30 MG/ML IJ SOLN
INTRAMUSCULAR | Status: AC
  Filled 2017-09-18: qty 1

## 2017-09-18 MED ORDER — SEVOFLURANE IN SOLN
RESPIRATORY_TRACT | Status: AC
  Filled 2017-09-18: qty 250

## 2017-09-18 MED ORDER — FENTANYL CITRATE (PF) 100 MCG/2ML IJ SOLN
INTRAMUSCULAR | Status: DC | PRN
  Administered 2017-09-18 (×2): 50 ug via INTRAVENOUS

## 2017-09-18 MED ORDER — HYDROCODONE-ACETAMINOPHEN 5-325 MG PO TABS
1.0000 | ORAL_TABLET | Freq: Four times a day (QID) | ORAL | Status: DC | PRN
  Administered 2017-09-18: 1 via ORAL
  Filled 2017-09-18: qty 1

## 2017-09-18 MED ORDER — SUGAMMADEX SODIUM 200 MG/2ML IV SOLN
INTRAVENOUS | Status: DC | PRN
  Administered 2017-09-18: 200 mg via INTRAVENOUS

## 2017-09-18 MED ORDER — DEXTROSE 5 % IV SOLN
2.0000 g | INTRAVENOUS | Status: DC
  Administered 2017-09-18: 2 g via INTRAVENOUS
  Filled 2017-09-18: qty 2

## 2017-09-18 MED ORDER — OXYCODONE HCL 5 MG PO TABS: 5.0000 mg | ORAL_TABLET | Freq: Once | ORAL | Status: DC | PRN

## 2017-09-18 MED ORDER — DEXTROSE 5 % IV SOLN: 2.0000 g | INTRAVENOUS | Status: DC

## 2017-09-18 MED ORDER — LACTATED RINGERS IV SOLN
INTRAVENOUS | Status: DC | PRN
  Administered 2017-09-18: 07:00:00 via INTRAVENOUS

## 2017-09-18 MED ORDER — SUCCINYLCHOLINE CHLORIDE 20 MG/ML IJ SOLN
INTRAMUSCULAR | Status: AC
  Filled 2017-09-18: qty 1

## 2017-09-18 MED ORDER — IOTHALAMATE MEGLUMINE 60 % INJ SOLN
INTRAMUSCULAR | Status: DC | PRN
  Administered 2017-09-18: 14 mL

## 2017-09-18 MED ORDER — KETOROLAC TROMETHAMINE 30 MG/ML IJ SOLN
INTRAMUSCULAR | Status: DC | PRN
  Administered 2017-09-18: 30 mg via INTRAVENOUS

## 2017-09-18 MED ORDER — FENTANYL CITRATE (PF) 100 MCG/2ML IJ SOLN
INTRAMUSCULAR | Status: AC
  Administered 2017-09-18: 25 ug via INTRAVENOUS
  Filled 2017-09-18: qty 2

## 2017-09-18 MED ORDER — MIDAZOLAM HCL 2 MG/2ML IJ SOLN
INTRAMUSCULAR | Status: DC | PRN
  Administered 2017-09-18: 1 mg via INTRAVENOUS

## 2017-09-18 MED ORDER — LIDOCAINE HCL (CARDIAC) 20 MG/ML IV SOLN
INTRAVENOUS | Status: DC | PRN
  Administered 2017-09-18: 80 mg via INTRAVENOUS

## 2017-09-18 MED ORDER — BUPIVACAINE HCL 0.5 % IJ SOLN
INTRAMUSCULAR | Status: DC | PRN
  Administered 2017-09-18: 20 mL

## 2017-09-18 MED ORDER — SODIUM CHLORIDE 0.9 % IJ SOLN
INTRAMUSCULAR | Status: DC | PRN
  Administered 2017-09-18: 50 mL via INTRAVENOUS

## 2017-09-18 MED ORDER — ONDANSETRON HCL 4 MG/2ML IJ SOLN
INTRAMUSCULAR | Status: DC | PRN
  Administered 2017-09-18: 4 mg via INTRAVENOUS

## 2017-09-18 MED ORDER — CEFAZOLIN SODIUM-DEXTROSE 2-3 GM-% IV SOLR
INTRAVENOUS | Status: DC | PRN
  Administered 2017-09-18: 2 g via INTRAVENOUS

## 2017-09-18 MED ORDER — SUGAMMADEX SODIUM 200 MG/2ML IV SOLN
INTRAVENOUS | Status: AC
  Filled 2017-09-18: qty 2

## 2017-09-18 MED ORDER — HYDROCODONE-ACETAMINOPHEN 5-325 MG PO TABS: 1.0000 | ORAL_TABLET | Freq: Four times a day (QID) | ORAL | 0 refills | Status: DC | PRN

## 2017-09-18 MED ORDER — OXYCODONE HCL 5 MG/5ML PO SOLN: 5.0000 mg | Freq: Once | ORAL | Status: DC | PRN

## 2017-09-18 MED ORDER — LIDOCAINE HCL (PF) 2 % IJ SOLN
INTRAMUSCULAR | Status: AC
  Filled 2017-09-18: qty 2

## 2017-09-18 MED ORDER — PROPOFOL 10 MG/ML IV BOLUS
INTRAVENOUS | Status: DC | PRN
  Administered 2017-09-18: 120 mg via INTRAVENOUS

## 2017-09-18 MED ORDER — ACETAMINOPHEN 10 MG/ML IV SOLN
INTRAVENOUS | Status: AC
  Filled 2017-09-18: qty 100

## 2017-09-18 SURGICAL SUPPLY — 43 items
ADHESIVE MASTISOL STRL (MISCELLANEOUS) ×3 IMPLANT
APPLIER CLIP ROT 10 11.4 M/L (STAPLE) ×3
BLADE SURG SZ11 CARB STEEL (BLADE) ×3 IMPLANT
CANISTER SUCT 1200ML W/VALVE (MISCELLANEOUS) ×3 IMPLANT
CATH CHOLANG 76X19 KUMAR (CATHETERS) ×3 IMPLANT
CHLORAPREP W/TINT 26ML (MISCELLANEOUS) ×3 IMPLANT
CLIP APPLIE ROT 10 11.4 M/L (STAPLE) ×1 IMPLANT
CLOSURE WOUND 1/2 X4 (GAUZE/BANDAGES/DRESSINGS) ×1
CONRAY 60ML FOR OR (MISCELLANEOUS) ×3 IMPLANT
DECANTER SPIKE VIAL GLASS SM (MISCELLANEOUS) ×6 IMPLANT
DRAPE SHEET LG 3/4 BI-LAMINATE (DRAPES) ×3 IMPLANT
DRSG TEGADERM 2-3/8X2-3/4 SM (GAUZE/BANDAGES/DRESSINGS) ×12 IMPLANT
DRSG TELFA 4X3 1S NADH ST (GAUZE/BANDAGES/DRESSINGS) ×3 IMPLANT
ELECT REM PT RETURN 9FT ADLT (ELECTROSURGICAL) ×3
ELECTRODE REM PT RTRN 9FT ADLT (ELECTROSURGICAL) ×1 IMPLANT
GLOVE BIO SURGEON STRL SZ7.5 (GLOVE) ×3 IMPLANT
GLOVE INDICATOR 8.0 STRL GRN (GLOVE) ×3 IMPLANT
GOWN STRL REUS W/ TWL LRG LVL3 (GOWN DISPOSABLE) ×3 IMPLANT
GOWN STRL REUS W/TWL LRG LVL3 (GOWN DISPOSABLE) ×6
GRASPER SUT TROCAR 14GX15 (MISCELLANEOUS) IMPLANT
IRRIGATION STRYKERFLOW (MISCELLANEOUS) ×1 IMPLANT
IRRIGATOR STRYKERFLOW (MISCELLANEOUS) ×3
IV NS 1000ML (IV SOLUTION)
IV NS 1000ML BAXH (IV SOLUTION) IMPLANT
L-HOOK LAP DISP 36CM (ELECTROSURGICAL) ×3
LABEL OR SOLS (LABEL) ×3 IMPLANT
LHOOK LAP DISP 36CM (ELECTROSURGICAL) ×1 IMPLANT
NEEDLE HYPO 25X1 1.5 SAFETY (NEEDLE) ×3 IMPLANT
NEEDLE VERESS 14GA 120MM (NEEDLE) ×3 IMPLANT
NS IRRIG 500ML POUR BTL (IV SOLUTION) ×3 IMPLANT
PACK LAP CHOLECYSTECTOMY (MISCELLANEOUS) ×3 IMPLANT
PENCIL ELECTRO HAND CTR (MISCELLANEOUS) ×3 IMPLANT
POUCH ENDO CATCH 10MM SPEC (MISCELLANEOUS) ×3 IMPLANT
SCISSORS METZENBAUM CVD 33 (INSTRUMENTS) ×3 IMPLANT
SLEEVE ENDOPATH XCEL 5M (ENDOMECHANICALS) ×6 IMPLANT
STRIP CLOSURE SKIN 1/2X4 (GAUZE/BANDAGES/DRESSINGS) ×2 IMPLANT
SUT MNCRL 4-0 (SUTURE) ×4
SUT MNCRL 4-0 27XMFL (SUTURE) ×2
SUT VICRYL 0 AB UR-6 (SUTURE) IMPLANT
SUTURE MNCRL 4-0 27XMF (SUTURE) ×2 IMPLANT
TROCAR XCEL 12X100 BLDLESS (ENDOMECHANICALS) ×3 IMPLANT
TROCAR XCEL NON-BLD 5MMX100MML (ENDOMECHANICALS) ×3 IMPLANT
TUBING INSUFFLATOR HI FLOW (MISCELLANEOUS) ×3 IMPLANT

## 2017-09-18 NOTE — Progress Notes (Signed)
Interpreter in to communicate with patient about rectal pain  Repositioned  Helped a little  Possible gas pain

## 2017-09-18 NOTE — Op Note (Signed)
Laparoscopic Cholecystectomy  Pre-operative Diagnosis: Gallstone pancreatitis  Post-operative Diagnosis: Same  Procedure: Laparoscopic cholecystectomy with intraoperative cholangiogram  Surgeon: Leonette Most T. Tonita Cong, MD FACS  Anesthesia: Gen. with endotracheal tube  Assistant: PA student  Procedure Details  The patient was seen again in the Holding Room. The benefits, complications, treatment options, and expected outcomes were discussed with the patient. The risks of bleeding, infection, recurrence of symptoms, failure to resolve symptoms, bile duct damage, bile duct leak, retained common bile duct stone, bowel injury, any of which could require further surgery and/or ERCP, stent, or papillotomy were reviewed with the patient. The likelihood of improving the patient's symptoms with return to their baseline status is good.  The patient and/or family concurred with the proposed plan, giving informed consent.  The patient was taken to Operating Room, identified as Mack Thurmon and the procedure verified as Laparoscopic Cholecystectomy.  A Time Out was held and the above information confirmed.  Prior to the induction of general anesthesia, antibiotic prophylaxis was administered. VTE prophylaxis was in place. General endotracheal anesthesia was then administered and tolerated well. After the induction, the abdomen was prepped with Chloraprep and draped in the sterile fashion. The patient was positioned in the supine position.  Local anesthetic  was injected into the skin near the umbilicus and an incision made. The Veress needle was placed. Pneumoperitoneum was then created with CO2 and tolerated well without any adverse changes in the patient's vital signs. A 5mm port was placed using an Optiview technique in the periumbilical position and the abdominal cavity was explored.  Two 5-mm ports were placed in the right upper quadrant and a 12 mm epigastric port was placed all under direct  vision. All skin incisions  were infiltrated with a local anesthetic agent before making the incision and placing the trocars.   The patient was positioned  in reverse Trendelenburg, tilted slightly to the patient's left.  The gallbladder was identified, the fundus grasped and retracted cephalad. Adhesions were lysed bluntly. The infundibulum was grasped and retracted laterally, exposing the peritoneum overlying the triangle of Calot. This was then divided and exposed in a blunt fashion. A critical view of the cystic duct and cystic artery was obtained.  The cystic duct was clearly identified and bluntly dissected.   A single clip was placed on the cystic artery and a Kumar clamp and catheter was used to perform a cholangiogram. Under fluoroscopy the cystic duct, common duct, right and left hepatic ducts, duodenum were all seen easily filling with contrast. There were no obvious filling defects or evidence of obstruction during the plantar gram. This completed the cholangiogram and the Clamp and catheter were removed without any difficulty. After which the previously dissected cystic duct and artery were serially clipped with endoclips and cut in between with Endo Shears.  The gallbladder was taken from the gallbladder fossa in a retrograde fashion with the electrocautery. The gallbladder was removed and placed in an Endocatch bag. The liver bed was irrigated and inspected. Hemostasis was achieved with the electrocautery. Copious irrigation was utilized and was repeatedly aspirated until clear.  A single area of bleeding was noted on the liver bed which was controlled with electrocautery. The gallbladder and Endocatch sac were then removed through the epigastric port site.   Inspection of the right upper quadrant was performed. No bleeding, bile duct injury or leak, or bowel injury was noted. Pneumoperitoneum was released.  The epigastric port site was closed with figure-of-eight 0 Vicryl sutures. 4-0  subcuticular Monocryl was used to close the skin. Steristrips and Mastisol and sterile dressings were  applied.  The patient was then extubated and brought to the recovery room in stable condition. Sponge, lap, and needle counts were correct at closure and at the conclusion of the case.   Findings: Acute Cholecystitis   Estimated Blood Loss: 10 mL         Drains: None         Specimens: Gallbladder           Complications: none               Keren Alverio T. Tonita Cong, MD, FACS

## 2017-09-18 NOTE — Anesthesia Procedure Notes (Signed)
Procedure Name: Intubation Date/Time: 09/18/2017 7:46 AM Performed by: Ginger Carne Pre-anesthesia Checklist: Patient identified, Emergency Drugs available, Suction available, Patient being monitored and Timeout performed Patient Re-evaluated:Patient Re-evaluated prior to induction Oxygen Delivery Method: Circle system utilized Preoxygenation: Pre-oxygenation with 100% oxygen Induction Type: IV induction Ventilation: Mask ventilation without difficulty Laryngoscope Size: Miller and 2 Grade View: Grade I Tube type: Oral Number of attempts: 1 Airway Equipment and Method: Stylet Placement Confirmation: ETT inserted through vocal cords under direct vision,  positive ETCO2 and breath sounds checked- equal and bilateral Secured at: 20 cm Tube secured with: Tape Dental Injury: Teeth and Oropharynx as per pre-operative assessment  Difficulty Due To: Difficulty was unanticipated

## 2017-09-18 NOTE — Transfer of Care (Signed)
Immediate Anesthesia Transfer of Care Note  Patient: Mckenzie Phelps  Procedure(s) Performed: Procedure(s): LAPAROSCOPIC CHOLECYSTECTOMY WITH INTRAOPERATIVE CHOLANGIOGRAM (N/A)  Patient Location: PACU  Anesthesia Type:General  Level of Consciousness: awake and alert   Airway & Oxygen Therapy: Patient Spontanous Breathing and Patient connected to face mask oxygen  Post-op Assessment: Report given to RN and Post -op Vital signs reviewed and stable  Post vital signs: Reviewed and stable  Last Vitals:  Vitals:   09/18/17 0455 09/18/17 0851  BP: (!) 99/59 112/68  Pulse: 65 91  Resp:  19  Temp: 36.8 C 36.8 C  SpO2: 100% 99%    Last Pain:  Vitals:   09/18/17 0851  TempSrc: Temporal  PainSc:          Complications: No apparent anesthesia complications

## 2017-09-18 NOTE — Progress Notes (Signed)
POCT pregnancy ordered. Done through glucometer. Resulted negative. Mckenzie Phelps E 7:39 AM 09/18/2017

## 2017-09-18 NOTE — Anesthesia Postprocedure Evaluation (Signed)
Anesthesia Post Note  Patient: Mckenzie Phelps  Procedure(s) Performed: Procedure(s) (LRB): LAPAROSCOPIC CHOLECYSTECTOMY WITH INTRAOPERATIVE CHOLANGIOGRAM (N/A)  Patient location during evaluation: PACU Anesthesia Type: General Level of consciousness: awake and alert and oriented Pain management: pain level controlled Vital Signs Assessment: post-procedure vital signs reviewed and stable Respiratory status: spontaneous breathing, nonlabored ventilation and respiratory function stable Cardiovascular status: blood pressure returned to baseline and stable Postop Assessment: no signs of nausea or vomiting Anesthetic complications: no     Last Vitals:  Vitals:   09/18/17 0941 09/18/17 0953  BP:    Pulse: 87   Resp: 16   Temp:  37.3 C  SpO2: 97%     Last Pain:  Vitals:   09/18/17 0941  TempSrc:   PainSc: 4                  Broderic Bara

## 2017-09-18 NOTE — Progress Notes (Signed)
CC: gallstone pancreatitis Subjective: Patient reports continued improvement overnight. States she is ready to have her gallbladder removed.  Objective: Vital signs in last 24 hours: Temp:  [98 F (36.7 C)-98.3 F (36.8 C)] 98.2 F (36.8 C) (09/28 0455) Pulse Rate:  [63-72] 65 (09/28 0455) BP: (99-110)/(56-61) 99/59 (09/28 0455) SpO2:  [99 %-100 %] 100 % (09/28 0455) Last BM Date: 09/17/17  Intake/Output from previous day: 09/27 0701 - 09/28 0700 In: 1339.2 [P.O.:360; I.V.:979.2] Out: -  Intake/Output this shift: No intake/output data recorded.  Physical exam:  Gen: NAD CV: RRR Pulm: CTA Abd: Soft, ND, NT  Lab Results: CBC   Recent Labs  09/15/17 2314 09/16/17 0446  WBC 6.4 7.5  HGB 13.4 12.3  HCT 38.6 35.1  PLT 305 253   BMET  Recent Labs  09/17/17 0419 09/18/17 0423  NA 140 141  K 3.3* 3.7  CL 112* 108  CO2 24 26  GLUCOSE 87 89  BUN <5* <5*  CREATININE 0.46 0.50  CALCIUM 8.1* 8.8*   PT/INR No results for input(s): LABPROT, INR in the last 72 hours. ABG No results for input(s): PHART, HCO3 in the last 72 hours.  Invalid input(s): PCO2, PO2  Studies/Results: Mr Abdomen Mrcp Wo Contrast  Result Date: 09/16/2017 CLINICAL DATA:  31 year old female with history of constant abdominal pain, most severe in the mid epigastric region falling meals for the past several weeks. No associated fevers, chills, nausea, vomiting, diarrhea or constipation. EXAM: MRI ABDOMEN WITHOUT CONTRAST  (INCLUDING MRCP) TECHNIQUE: Multiplanar multisequence MR imaging of the abdomen was performed. Heavily T2-weighted images of the biliary and pancreatic ducts were obtained, and three-dimensional MRCP images were rendered by post processing. COMPARISON:  No priors. FINDINGS: Comment: Study is limited for detection and characterization of visceral and/or vascular lesions by lack of IV gadolinium. Lower chest: Unremarkable. Hepatobiliary: No cystic or solid hepatic lesions. No intra  or extrahepatic biliary ductal dilatation noted on MRCP images. Gallbladder is only moderately distended. Filling defects in the gallbladder, compatible with gallstones, measuring up to 13 mm in diameter. Gallbladder wall appears edematous with trace volume of pericholecystic fluid. No filling defect in the common bile duct noted on MRCP images. Common bile duct measures 4 mm in the porta hepatis. Pancreas: No definite pancreatic mass on today's noncontrast examination. No pancreatic ductal dilatation noted on MRCP images. Trace amount of peripancreatic T2 hyperintensity, suggesting fluid in inflammation around the pancreas, concerning for an acute pancreatitis. No well-defined peripancreatic fluid collections are noted. Spleen:  Unremarkable. Adrenals/Urinary Tract: Bilateral kidneys and bilateral adrenal glands are normal in appearance. No hydroureteronephrosis in the visualized portions of the abdomen. Stomach/Bowel: Visualized portions are unremarkable. Vascular/Lymphatic: No aneurysm identified in the visualized abdominal vasculature. No lymphadenopathy noted in the abdomen. Other: Trace volume of ascites extending beneath the right lobe of the liver. Musculoskeletal: No aggressive appearing osseous lesions are noted in the visualized portions of the skeleton. IMPRESSION: 1. Small amount of peripancreatic fluid and inflammatory changes compatible with an acute pancreatitis. 2. Interval development of edema in the gallbladder wall and trace amount of pericholecystic fluid. There are gallstones present, but the gallbladder is only moderately distended. Overall, imaging findings are equivocal for an acute cholecystitis, and the interval change in the appearance of the gallbladder compared with yesterday's ultrasound examination may simply be reactive related to inflammatory changes in the setting of acute pancreatitis. Clinical correlation is recommended. 3. No evidence of choledocholithiasis. No intra or  extrahepatic biliary ductal dilatation to suggest obstruction.  Electronically Signed   By: Trudie Reed M.D.   On: 09/16/2017 20:36    Anti-infectives: Anti-infectives    None      Assessment/Plan:  31 year old female with resolved gallstone pancreatitis. Again discussed the planned surgery and all questions answered to the patient's satisfaction. Pregnancy test is still pending. Then to the OR this morning for laparoscopic cholecystectomy. If she does well, could possibly be discharged home later today.  Miller Limehouse T. Tonita Cong, MD, [facility] General Surgeon [facility]  [facility] [phone] Night [facility] [phone] 09/18/2017

## 2017-09-18 NOTE — Anesthesia Post-op Follow-up Note (Signed)
Anesthesia QCDR form completed.        

## 2017-09-18 NOTE — Progress Notes (Signed)
   Patient seen and re-examined with interpreter.  Patient doing well.  OK to discharge home.  F/U has been scheduled for next week.  Ricarda Frame, MD [facility] General Surgeon [facility]  [facility] [phone] Night [facility] [phone]

## 2017-09-18 NOTE — Progress Notes (Signed)
Interpreter in to help with translation  Throat is sore ice given  Cold  Blankets applied  Pain level 4 out of 10 but does not want any more pain medication

## 2017-09-18 NOTE — Discharge Summary (Addendum)
[facility] [place] [facility]   PATIENT NAME: Kaytlin Burklow    MR#:  [id number]  DATE OF BIRTH:  [birth year]  DATE OF ADMISSION:  09/15/2017   ADMITTING PHYSICIAN: Oralia Manis, MD  DATE OF DISCHARGE: 09/18/2017 PRIMARY CARE PHYSICIAN: Patient, No Pcp Per   ADMISSION DIAGNOSIS:  Abdominal pain [R10.9] Acute pancreatitis, unspecified complication status, unspecified pancreatitis type [K85.90] DISCHARGE DIAGNOSIS:  Principal Problem:   Pancreatitis Active Problems:   Gallstone acute cholecystitis SECONDARY DIAGNOSIS:   Past Medical History:  Diagnosis Date  . Colitis   . Gall stone    HOSPITAL COURSE:  Acute pancreatitis due to gallstone. Improved. She is treated with when necessary analgesia and antiemetics, IV fluids. Per Dr. Tobi Bastos, MRCP: Overall, imaging findings are equivocal for an acute cholecystitis. No evidence of choledocholithiasis.  s/p laparoscopy cholecystectomy today. Per Dr. Tonita Cong, no need to give po abx after discarge.  Hypokalemia. Give potassium supplement and improved. Abnormal liver function tests. Due to above. Improved. History of colitis per patient. Stable.   I discussed with Dr. Tonita Cong. DISCHARGE CONDITIONS:  Stable, discharge to home today. CONSULTS OBTAINED:  Treatment Team:  Ricarda Frame, MD DRUG ALLERGIES:   Allergies  Allergen Reactions  . Ciprofloxacin Swelling   DISCHARGE MEDICATIONS:   Allergies as of 09/18/2017      Reactions   Ciprofloxacin Swelling      Medication List    TAKE these medications   HYDROcodone-acetaminophen 5-325 MG tablet Commonly known as:  NORCO/VICODIN Take 1 tablet by mouth every 6 (six) hours as needed for moderate pain or severe pain.            Discharge Care Instructions        Start     Ordered   09/18/17 0000  Increase activity slowly     09/18/17 1036   09/18/17 0000  Diet - low sodium heart healthy     09/18/17 1036   09/18/17 0000   HYDROcodone-acetaminophen (NORCO/VICODIN) 5-325 MG tablet  Every 6 hours PRN     09/18/17 1037       DISCHARGE INSTRUCTIONS:  See AVS.  If you experience worsening of your admission symptoms, develop shortness of breath, life threatening emergency, suicidal or homicidal thoughts you must seek medical attention immediately by calling 911 or calling your MD immediately  if symptoms less severe.  You Must read complete instructions/literature along with all the possible adverse reactions/side effects for all the Medicines you take and that have been prescribed to you. Take any new Medicines after you have completely understood and accpet all the possible adverse reactions/side effects.   Please note  You were cared for by a hospitalist during your hospital stay. If you have any questions about your discharge medications or the care you received while you were in the hospital after you are discharged, you can call the unit and asked to speak with the hospitalist on call if the hospitalist that took care of you is not available. Once you are discharged, your primary care physician will handle any further medical issues. Please note that NO REFILLS for any discharge medications will be authorized once you are discharged, as it is imperative that you return to your primary care physician (or establish a relationship with a primary care physician if you do not have one) for your aftercare needs so that they can reassess your need for medications and monitor your lab values.    On the day of Discharge:  VITAL SIGNS:  Blood pressure 107/65, pulse 76, temperature 98.3 F (36.8 C), temperature source Oral, resp. rate 17, height  (1.549 m), weight 109 lb 8 oz (49.7 kg), last menstrual period 09/14/2017, SpO2 97 %. PHYSICAL EXAMINATION:  GENERAL:  31 y.o.-year-old patient lying in the bed with no acute distress.  EYES: Pupils equal, round, reactive to light and accommodation. No scleral icterus.  Extraocular muscles intact.  HEENT: Head atraumatic, normocephalic. Oropharynx and nasopharynx clear.  NECK:  Supple, no jugular venous distention. No thyroid enlargement, no tenderness.  LUNGS: Normal breath sounds bilaterally, no wheezing, rales,rhonchi or crepitation. No use of accessory muscles of respiration.  CARDIOVASCULAR: S1, S2 normal. No murmurs, rubs, or gallops.  ABDOMEN: Soft, non-tender, non-distended. Bowel sounds present. No organomegaly or mass.  EXTREMITIES: No pedal edema, cyanosis, or clubbing.  NEUROLOGIC: Cranial nerves II through XII are intact. Muscle strength 5/5 in all extremities. Sensation intact. Gait not checked.  PSYCHIATRIC: The patient is alert and oriented x 3.  SKIN: No obvious rash, lesion, or ulcer.  DATA REVIEW:   CBC  Recent Labs Lab 09/16/17 0446  WBC 7.5  HGB 12.3  HCT 35.1  PLT 253    Chemistries   Recent Labs Lab 09/17/17 0419 09/18/17 0423  NA 140 141  K 3.3* 3.7  CL 112* 108  CO2 24 26  GLUCOSE 87 89  BUN <5* <5*  CREATININE 0.46 0.50  CALCIUM 8.1* 8.8*  MG 1.8  --   AST 48*  --   ALT 61*  --   ALKPHOS 107  --   BILITOT 0.9  --      Microbiology Results  No results found for this or any previous visit.  RADIOLOGY:  Dg Cholangiogram Operative  Result Date: 09/18/2017 CLINICAL DATA:  31 year old female with a history of cholelithiasis EXAM: INTRAOPERATIVE CHOLANGIOGRAM TECHNIQUE: Cholangiographic images from the C-arm fluoroscopic device were submitted for interpretation post-operatively. Please see the procedural report for the amount of contrast and the fluoroscopy time utilized. COMPARISON:  None. FINDINGS: Surgical instruments project over the upper abdomen. There is cannulation of the cystic duct/gallbladder neck, with antegrade infusion of contrast. Caliber of the extrahepatic ductal system within normal limits. No large filling defect identified. Free flow of contrast across the ampulla. IMPRESSION: Intraoperative  cholangiogram demonstrates extrahepatic biliary ducts of unremarkable caliber, with no large filling defect identified. Free flow of contrast across the ampulla. Please refer to the dictated operative report for full details of intraoperative findings and procedure Electronically Signed   By: Gilmer Mor D.O.   On: 09/18/2017 08:39     Management plans discussed with the patient, her father and they are in agreement.  CODE STATUS: Full Code   TOTAL TIME TAKING CARE OF THIS PATIENT: 33 minutes.    Shaune Pollack M.D on 09/18/2017 at 3:08 PM  Between 7am to 6pm - Pager - [phone]  After 6pm go to [email or web address] - Social research officer, government  [facility] [place] [facility]  Office  [phone]  CC: Primary care physician; Patient, No Pcp Per   Note: This dictation was prepared with Dragon dictation along with smaller phrase technology. Any transcriptional errors that result from this process are unintentional.

## 2017-09-18 NOTE — Progress Notes (Signed)
Belching when repostioned

## 2017-09-18 NOTE — Progress Notes (Signed)
Mckenzie Phelps  A and O x 4. VSS. Pt tolerating diet well. No complaints of pain or nausea. IV removed intact, prescriptions given. Pt voiced understanding of discharge instructions with no further questions. Pt discharged via wheelchair with nurse tech. Translator was present at discharge.     Allergies as of 09/18/2017      Reactions   Ciprofloxacin Swelling      Medication List    TAKE these medications   HYDROcodone-acetaminophen 5-325 MG tablet Commonly known as:  NORCO/VICODIN Take 1 tablet by mouth every 6 (six) hours as needed for moderate pain or severe pain.            Discharge Care Instructions        Start     Ordered   09/18/17 0000  Increase activity slowly     09/18/17 1036   09/18/17 0000  Diet - low sodium heart healthy     09/18/17 1036   09/18/17 0000  HYDROcodone-acetaminophen (NORCO/VICODIN) 5-325 MG tablet  Every 6 hours PRN     09/18/17 1037      Vitals:   09/18/17 1055 09/18/17 1125  BP: 110/66 107/65  Pulse: 81 76  Resp: 18 17  Temp: 98.2 F (36.8 C) 98.3 F (36.8 C)  SpO2: 97% 97%    Suzzanne Cloud

## 2017-09-18 NOTE — Progress Notes (Signed)
Hold Lovenox per Dr. Tonita Cong orders.

## 2017-09-18 NOTE — Progress Notes (Signed)
Gave report to [place] in OR for patient's surgery

## 2017-09-18 NOTE — Brief Op Note (Signed)
09/15/2017 - 09/18/2017  8:44 AM  PATIENT:  Mckenzie Phelps  31 y.o. female  PRE-OPERATIVE DIAGNOSIS:  gallstones  POST-OPERATIVE DIAGNOSIS:  gallstones  PROCEDURE:  Procedure(s): LAPAROSCOPIC CHOLECYSTECTOMY WITH INTRAOPERATIVE CHOLANGIOGRAM (N/A)  SURGEON:  Surgeon(s) and Role:    * Ricarda Frame, MD - Primary  PHYSICIAN ASSISTANT:   ASSISTANTS: none   ANESTHESIA:   general  EBL:  Total I/O In: 600 [I.V.:600] Out: 3 [Blood:3]  BLOOD ADMINISTERED:none  DRAINS: none   LOCAL MEDICATIONS USED:  BUPIVICAINE , LIDOCAINE  and Amount: 20 ml  SPECIMEN:  Source of Specimen:  gallbladder  DISPOSITION OF SPECIMEN:  PATHOLOGY  COUNTS:  YES  TOURNIQUET:  * No tourniquets in log *  DICTATION: .Dragon Dictation  PLAN OF CARE: return to inpatient status  PATIENT DISPOSITION:  PACU - hemodynamically stable.   Delay start of Pharmacological VTE agent (>24hrs) due to surgical blood loss or risk of bleeding: no

## 2017-09-18 NOTE — Care Management (Signed)
Patient to discharge home today.  Spoke with patient via Spanish interpreter.  Patient self pay.  Does not have PCP.  Patient provided Spanish application to [facility] and Medication Management.  Patient to discharge with script for Norco.  Patient request for goodrx coupon to be printed for [facility].  Coupon provided to patient.  Out of pocket cost $7.98.  RNCM signing off.

## 2017-09-18 NOTE — Anesthesia Preprocedure Evaluation (Signed)
Anesthesia Evaluation  Patient identified by MRN, date of birth, ID band Patient awake    Reviewed: Allergy & Precautions, NPO status , Patient's Chart, lab work & pertinent test results  History of Anesthesia Complications Negative for: history of anesthetic complications  Airway Mallampati: I  TM Distance: >3 FB Neck ROM: Full    Dental no notable dental hx.    Pulmonary neg pulmonary ROS, neg sleep apnea, neg COPD,    breath sounds clear to auscultation- rhonchi (-) wheezing      Cardiovascular Exercise Tolerance: Good (-) hypertension(-) CAD and (-) Past MI  Rhythm:Regular Rate:Normal - Systolic murmurs and - Diastolic murmurs    Neuro/Psych negative neurological ROS  negative psych ROS   GI/Hepatic Neg liver ROS, IBS   Endo/Other  negative endocrine ROSneg diabetes  Renal/GU negative Renal ROS     Musculoskeletal negative musculoskeletal ROS (+)   Abdominal (+) - obese,   Peds  Hematology negative hematology ROS (+)   Anesthesia Other Findings Past Medical History: No date: Colitis No date: Gall stone   Reproductive/Obstetrics                             Anesthesia Physical Anesthesia Plan  ASA: II  Anesthesia Plan: General   Post-op Pain Management:    Induction: Intravenous  PONV Risk Score and Plan: 1 and Ondansetron and Dexamethasone  Airway Management Planned: Oral ETT  Additional Equipment:   Intra-op Plan:   Post-operative Plan: Extubation in OR  Informed Consent: I have reviewed the patients History and Physical, chart, labs and discussed the procedure including the risks, benefits and alternatives for the proposed anesthesia with the patient or authorized representative who has indicated his/her understanding and acceptance.   Dental advisory given  Plan Discussed with: CRNA and Anesthesiologist  Anesthesia Plan Comments:         Anesthesia Quick  Evaluation

## 2017-09-19 ENCOUNTER — Encounter: Payer: Self-pay | Admitting: General Surgery

## 2017-09-21 ENCOUNTER — Telehealth: Payer: Self-pay

## 2017-09-21 LAB — SURGICAL PATHOLOGY

## 2017-09-21 NOTE — Telephone Encounter (Signed)
Called patient but had to leave him a voicemail to return my call.   Post-op call made to patient at this time. Spoke with . Post-op interview questions below.  1. How are you feeling?   2. Is your pain controlled?   3. What are you doing for the pain?   4. Are you having any Nausea or Vomiting?  5. Are you having any Fever or Chills?  6. Are you having any Constipation or Diarrhea?  7. Is there any Swelling or Bruising you are concerned about?   8. Do you have any questions or concerns at this time?    Discussion:

## 2017-09-21 NOTE — Telephone Encounter (Signed)
Post-op call made to patient at this time. Spoke with patient. Post-op interview questions below.  1. How are you feeling? She is feeling well.  2. Is your pain controlled? yes  3. What are you doing for the pain? Taking the pain medication.  4. Are you having any Nausea or Vomiting? No    5. Are you having any Fever or Chills? No  6. Are you having any Constipation or Diarrhea? Costipation. Patient was advised to take Miralax and to walk and drink lots of water.  7. Is there any Swelling or Bruising you are concerned about? No   8. Do you have any questions or concerns at this time? Not at this time.   Patient was reminded to come to her post-op appointment.

## 2017-09-25 ENCOUNTER — Ambulatory Visit (INDEPENDENT_AMBULATORY_CARE_PROVIDER_SITE_OTHER): Payer: Self-pay | Admitting: General Surgery

## 2017-09-25 ENCOUNTER — Encounter: Payer: Self-pay | Admitting: General Surgery

## 2017-09-25 VITALS — BP 106/72 | HR 78 | Temp 97.8°F | Ht 61.0 in | Wt 99.0 lb

## 2017-09-25 DIAGNOSIS — Z4889 Encounter for other specified surgical aftercare: Secondary | ICD-10-CM

## 2017-09-25 NOTE — Patient Instructions (Signed)
Le vamos a referir al Programmer, applications para que la puedan ver sobre el malestar que esta teniendo en el abdomen.  Si tiene preguntas, por favor llamenos al [phone].

## 2017-09-25 NOTE — Progress Notes (Signed)
Outpatient Surgical Follow Up  09/25/2017  Mckenzie Phelps is an 31 y.o. female.   Chief Complaint  Patient presents with  . Routine Post Op     Laparoscopic cholecystectomy with intraoperative cholangiogram 09/18/17 Dr. Tonita Cong    HPI: Patient reports that her surgery sites are healing well but she is having right lower quadrant abdominal pain similar to her previous bouts of colitis. She has been having bowel function but has to take laxatives to stimulate the. She is not currently taking any pain medications. She denies any fevers, chills, nausea, vomiting, chest pain, shortness of breath. The upper abdominal pain she had from her pancreatitis is completely resolved.  Past Medical History:  Diagnosis Date  . Colitis   . Gall stone     Past Surgical History:  Procedure Laterality Date  . CHOLECYSTECTOMY N/A 09/18/2017   Procedure: LAPAROSCOPIC CHOLECYSTECTOMY WITH INTRAOPERATIVE CHOLANGIOGRAM;  Surgeon: Ricarda Frame, MD;  Location: [facility] ORS;  Service: General;  Laterality: N/A;  . NO PAST SURGERIES      Family History  Problem Relation Age of Onset  . Healthy Mother   . Varicose Veins Father   . Cancer Maternal Grandfather        Not sure what kind he had    Social History:  reports that she has never smoked. She has never used smokeless tobacco. She reports that she does not drink alcohol or use drugs.  Allergies:  Allergies  Allergen Reactions  . Ciprofloxacin Swelling    Medications reviewed.    ROS A multipoint review of systems was completed, all pertinent positives and negatives are documented in the history of present illness and remainder are negative   BP 106/72   Pulse 78   Temp 97.8 F (36.6 C) (Oral)   Ht  (1.549 m)   Wt 44.9 kg (99 lb)   LMP 09/14/2017 (Exact Date)   BMI 18.71 kg/m   Physical Exam Gen.: No acute distress  chest: Clear to auscultation Heart: Regular rhythm Abdomen: Soft, mildly tender to deep palpation in  the right lower quadrant, nondistended. Well approximated laparoscopic incision sites without evidence of erythema or drainage.    No results found for this or any previous visit (from the past 48 hour(s)). No results found.  Assessment/Plan:  1. Aftercare following surgery 31 year old female status post laparoscopic cholecystectomy. Pathology reviewed with patient. Discussed anticipated recovery and wound care. Patient voiced understanding. Given her right lower quadrant pain and history of colitis discussed that we will refer her to gastroenterology. She states she plans to return to [place] but is willing see gastrology when she returns from her next trip. Provided with follow-up precautions and she will follow up with [place] on an as-needed basis.     Ricarda Frame, MD FACS General Surgeon  09/25/2017,12:05 PM

## 2018-03-01 IMAGING — MR MR MRCP
6 of 11 series · 24 of 48 positions shown · non-contrast
Comparison: No priors.

CLINICAL DATA: 31-year-old female with history of constant
abdominal pain, most severe in the mid epigastric region falling
meals for the past several weeks. No associated fevers, chills,
nausea, vomiting, diarrhea or constipation.

EXAM:
MRI ABDOMEN WITHOUT CONTRAST  (INCLUDING MRCP)
TECHNIQUE: Multiplanar multisequence MR imaging of the abdomen was performed.
Heavily T2-weighted images of the biliary and pancreatic ducts were
obtained, and three-dimensional MRCP images were rendered by post
processing.

[Series 3: cor tru fisp · coronal · 4.0mm · 0.74mm/px · 5 of 45 slices shown]
[im 1/45]
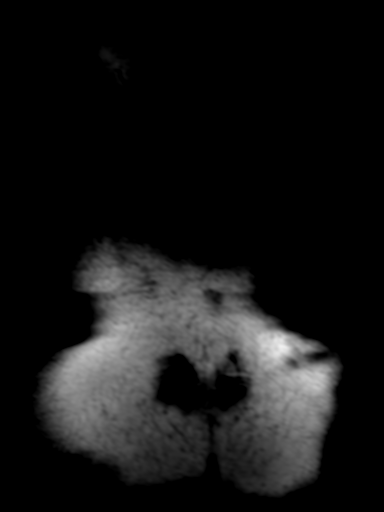
[im 12/45]
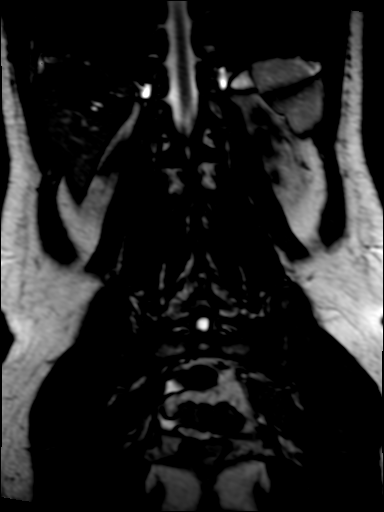
[im 23/45]
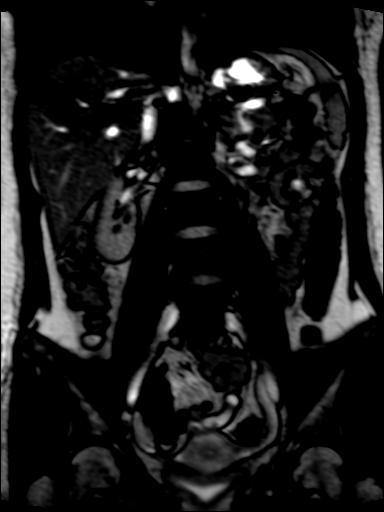
[im 34/45]
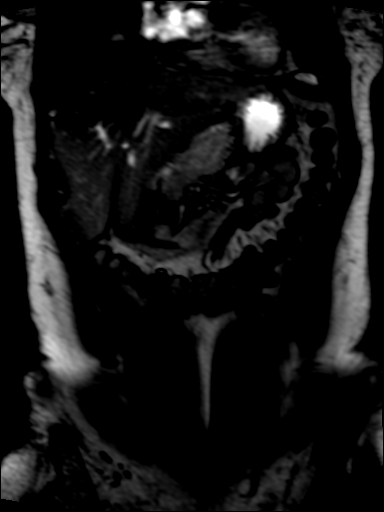
[im 45/45]
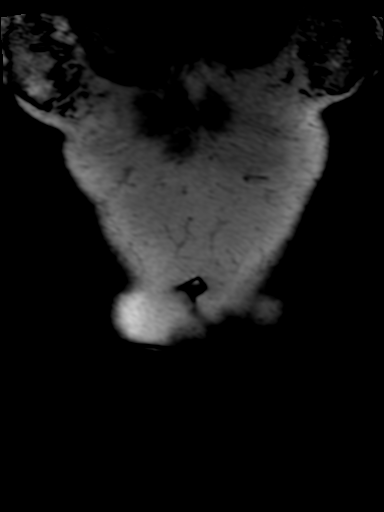

[Series 4: T2 fat-sat · axial · 7.0mm · 0.62mm/px · z∈[-13,+188]mm · 3 of 25 slices shown]
[im 1/25]
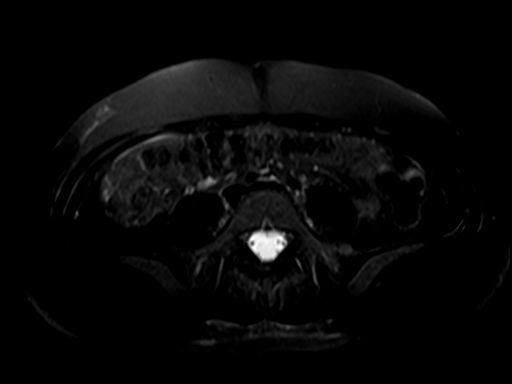
[im 13/25]
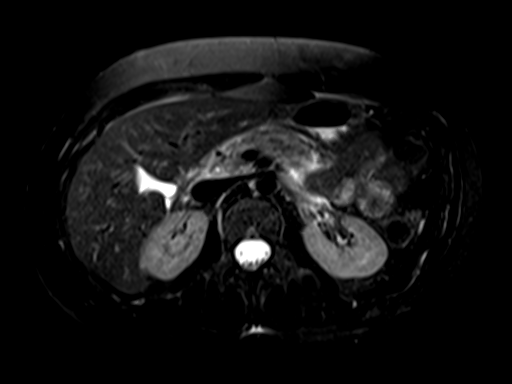
[im 25/25]
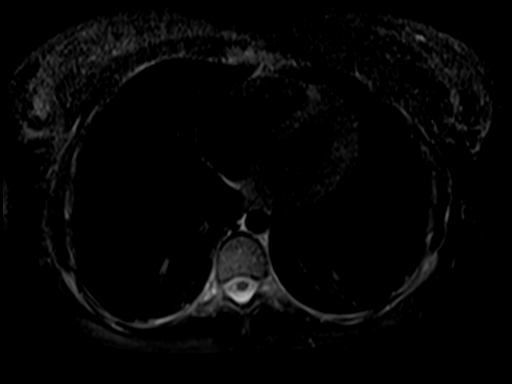

[Series 5: T2 · axial · 7.0mm · 0.62mm/px · z∈[-13,+188]mm · 3 of 25 slices shown]
[im 1/25]
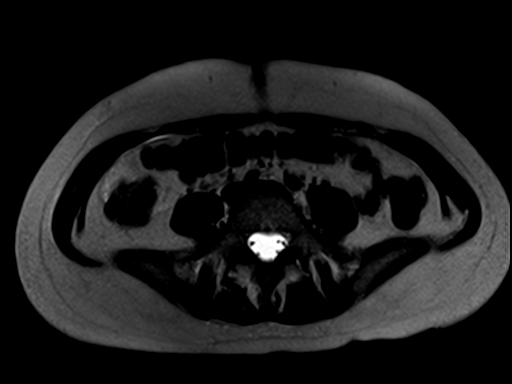
[im 13/25]
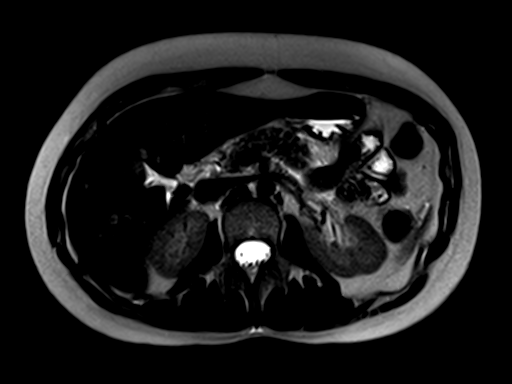
[im 25/25]
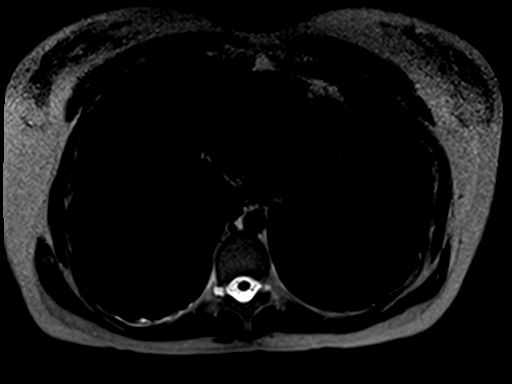

[Series 6: ax dual echo · axial · 7.0mm · 0.62mm/px · z∈[-13,+188]mm · 5 of 50 slices shown]
[im 1/50]
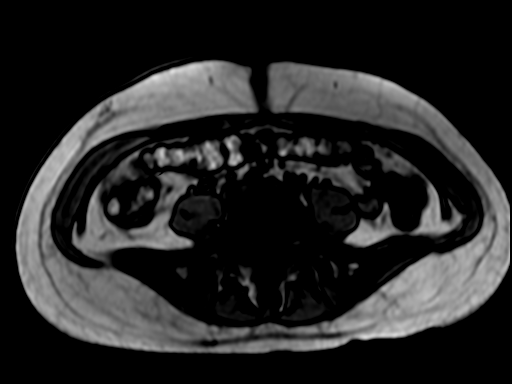
[im 13/50]
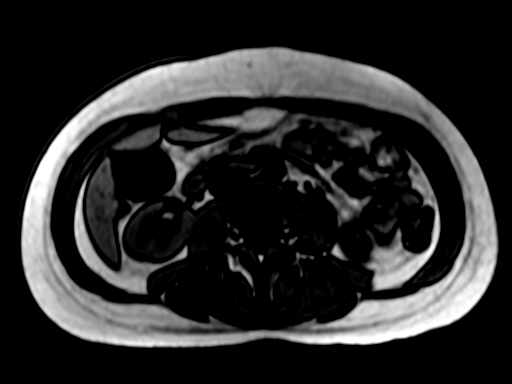
[im 25/50]
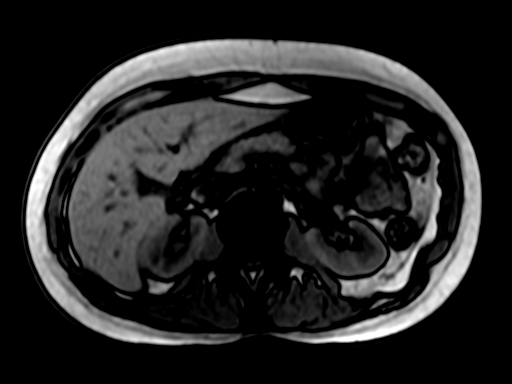
[im 37/50]
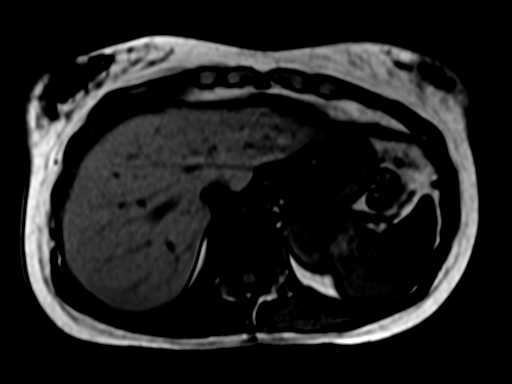
[im 50/50]
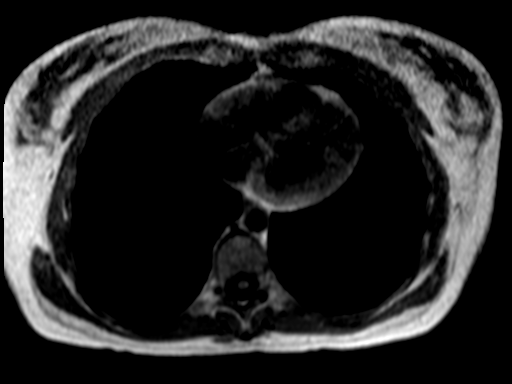

[Series 10: cor thins · coronal · 4.0mm · 0.89mm/px · 2 of 17 slices shown]
[im 1/17]
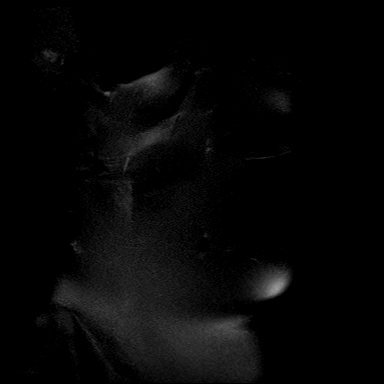
[im 17/17]
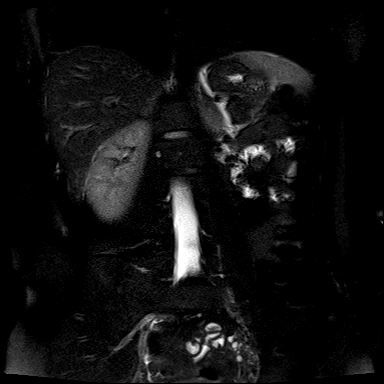

[Series 11: T1 dynamic fat-sat · axial · non-contrast · 2.5mm · 0.62mm/px · z∈[-21,+141]mm · 6 of 88 slices shown]
[im 1/88]
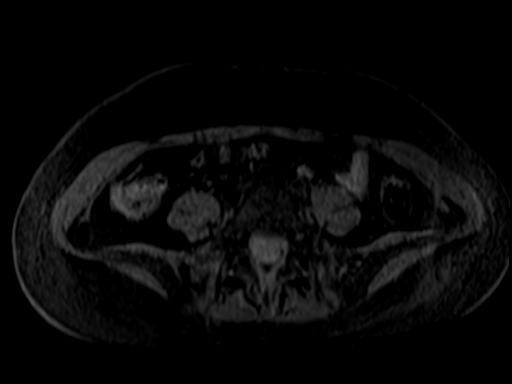
[im 11/88]
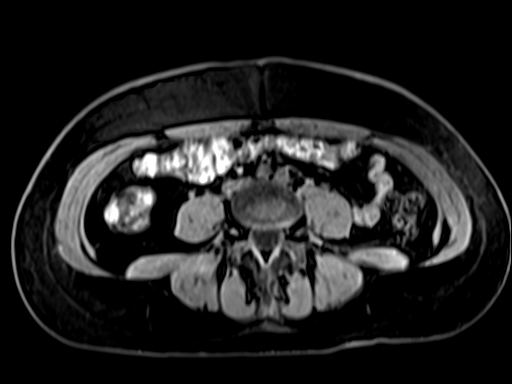
[im 22/88]
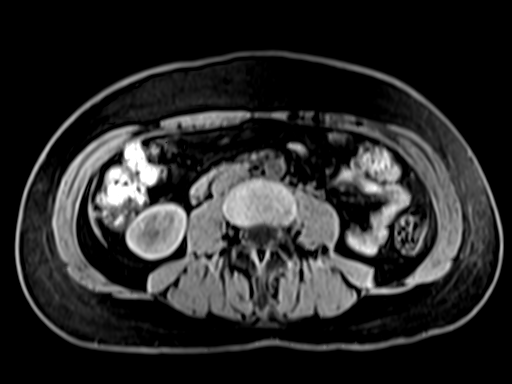
[im 33/88]
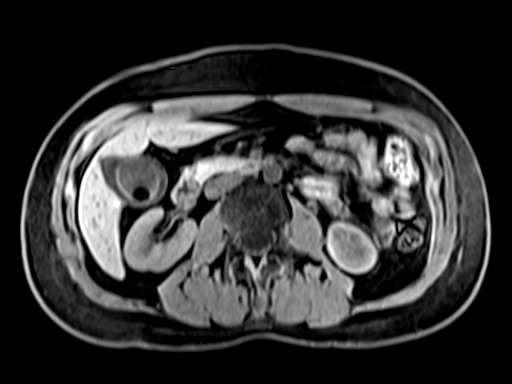
[im 55/88]
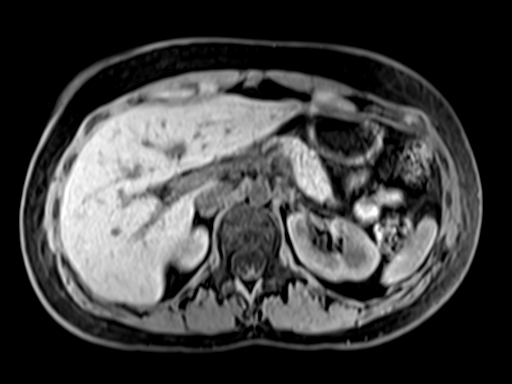
[im 66/88]
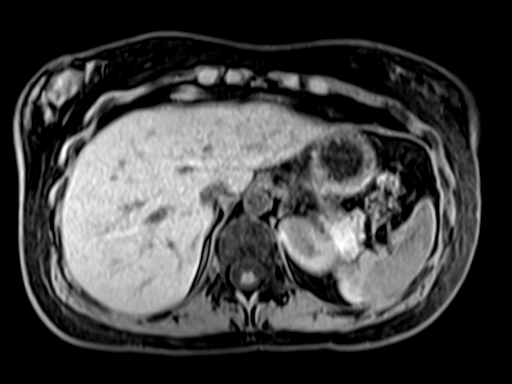

[24 of 48 positions shown; findings below may reference images not displayed]

FINDINGS: Comment: Study is limited for detection and characterization of
visceral and/or vascular lesions by lack of IV gadolinium.

Lower chest: Unremarkable.

Hepatobiliary: No cystic or solid hepatic lesions. No intra or
extrahepatic biliary ductal dilatation noted on MRCP images.
Gallbladder is only moderately distended. Filling defects in the
gallbladder, compatible with gallstones, measuring up to 13 mm in
diameter. Gallbladder wall appears edematous with trace volume of
pericholecystic fluid. No filling defect in the common bile duct
noted on MRCP images. Common bile duct measures 4 mm in the porta
hepatis.

Pancreas: No definite pancreatic mass on today's noncontrast
examination. No pancreatic ductal dilatation noted on MRCP images.
Trace amount of peripancreatic T2 hyperintensity, suggesting fluid
in inflammation around the pancreas, concerning for an acute
pancreatitis. No well-defined peripancreatic fluid collections are
noted.

Spleen:  Unremarkable.

Adrenals/Urinary Tract: Bilateral kidneys and bilateral adrenal
glands are normal in appearance. No hydroureteronephrosis in the
visualized portions of the abdomen.

Stomach/Bowel: Visualized portions are unremarkable.

Vascular/Lymphatic: No aneurysm identified in the visualized
abdominal vasculature. No lymphadenopathy noted in the abdomen.

Other: Trace volume of ascites extending beneath the right lobe of
the liver.

Musculoskeletal: No aggressive appearing osseous lesions are noted
in the visualized portions of the skeleton.
IMPRESSION: 1. Small amount of peripancreatic fluid and inflammatory changes
compatible with an acute pancreatitis.
2. Interval development of edema in the gallbladder wall and trace
amount of pericholecystic fluid. There are gallstones present, but
the gallbladder is only moderately distended. Overall, imaging
findings are equivocal for an acute cholecystitis, and the interval
change in the appearance of the gallbladder compared with
yesterday's ultrasound examination may simply be reactive related to
inflammatory changes in the setting of acute pancreatitis. Clinical
correlation is recommended.
3. No evidence of choledocholithiasis. No intra or extrahepatic
biliary ductal dilatation to suggest obstruction.
